# Patient Record
Sex: Female | Born: 2000 | Race: White | Hispanic: No | Marital: Single | Smoking: Never smoker
Health system: Southern US, Community
[De-identification: ages and names within clinical notes are randomized; demographics above are authoritative.]

## PROBLEM LIST (undated history)

## (undated) DIAGNOSIS — Z789 Other specified health status: Secondary | ICD-10-CM

## (undated) HISTORY — PX: NO PAST SURGERIES: SHX2092

---
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
## 2019-01-09 DIAGNOSIS — Z1152 Encounter for screening for COVID-19: Secondary | ICD-10-CM | POA: Diagnosis not present

## 2019-01-12 ENCOUNTER — Ambulatory Visit: Payer: BLUE CROSS/BLUE SHIELD | Attending: Internal Medicine

## 2019-01-12 DIAGNOSIS — Z20822 Contact with and (suspected) exposure to covid-19: Secondary | ICD-10-CM

## 2019-01-13 LAB — NOVEL CORONAVIRUS, NAA: SARS-CoV-2, NAA: NOT DETECTED

## 2019-02-28 DIAGNOSIS — Z1152 Encounter for screening for COVID-19: Secondary | ICD-10-CM | POA: Diagnosis not present

## 2019-03-23 ENCOUNTER — Other Ambulatory Visit: Payer: BLUE CROSS/BLUE SHIELD

## 2019-04-30 ENCOUNTER — Other Ambulatory Visit: Payer: Self-pay | Admitting: Obstetrics and Gynecology

## 2019-04-30 NOTE — Telephone Encounter (Signed)
Medication refill request: Junel  Last AEX:  11/27/2018  Next AEX:  08/20/2019 with JJ Last MMG (if hormonal medication request): n/a Refill authorized: #[id number], [id number] pended.   Please advise and refill if appropriate.

## 2019-05-15 DIAGNOSIS — H1010 Acute atopic conjunctivitis, unspecified eye: Secondary | ICD-10-CM | POA: Diagnosis not present

## 2019-05-15 DIAGNOSIS — R509 Fever, unspecified: Secondary | ICD-10-CM | POA: Diagnosis not present

## 2019-05-15 DIAGNOSIS — J309 Allergic rhinitis, unspecified: Secondary | ICD-10-CM | POA: Diagnosis not present

## 2019-05-18 ENCOUNTER — Ambulatory Visit: Payer: BLUE CROSS/BLUE SHIELD | Admitting: Nurse Practitioner

## 2019-05-18 ENCOUNTER — Encounter: Payer: Self-pay | Admitting: Nurse Practitioner

## 2019-05-18 ENCOUNTER — Other Ambulatory Visit: Payer: Self-pay

## 2019-05-18 VITALS — BP 110/72 | HR 111 | Temp 98.2°F | Resp 18 | Wt 152.0 lb

## 2019-05-18 DIAGNOSIS — W57XXXA Bitten or stung by nonvenomous insect and other nonvenomous arthropods, initial encounter: Secondary | ICD-10-CM | POA: Diagnosis not present

## 2019-05-18 DIAGNOSIS — S0086XA Insect bite (nonvenomous) of other part of head, initial encounter: Secondary | ICD-10-CM

## 2019-05-18 DIAGNOSIS — T7840XA Allergy, unspecified, initial encounter: Secondary | ICD-10-CM

## 2019-05-18 NOTE — Progress Notes (Signed)
Established Patient Office Visit  Subjective:  Patient ID: Kayla May, female    DOB: [birth year]  Age: 19 y.o. MRN: [id number]  CC:  Chief Complaint  Patient presents with  . Insect Bite    05/04, on forehead, fever 101 and eyes swollen, fever went away and came back on 05/06, lump on L side of neck    HPI Kayla May  Is a 19 year old female presenting for a bug bite that occurred Tuesday last week.  She developed a fever of 101 with bilateral orbital swelling that led her to seek urgent care on Wed. Her fever had subsided by then. The Urgent care instructed her to take zyrtec in daytime and benadryl at bedtime with allergy eye drops. They prescribed her prednisone instructing her to start taking if the eye drops did not decrease the periorbital swelling. Today with the periorbital swelling continuing she did take two doses of the prednisone and her swelling has resolved. She denied fever, chills, gu/gi sxs, edema, sob, throat or facial swelling.   Past Medical History:  Diagnosis Date  . Medical history non-contributory     Past Surgical History:  Procedure Laterality Date  . ANTERIOR CRUCIATE LIGAMENT REPAIR Right 07/18/2017   Procedure: ANTERIOR CRUCIATE LIGAMENT (ACL) REPAIR and meniscal repair lateral and medial;  Surgeon: Carole Civil, MD;  Location: [facility];  Service: Orthopedics;  Laterality: Right;  on 07/18/17  . NO PAST SURGERIES      Family History  Problem Relation Age of Onset  . Diabetes Father   . Heart disease Father   . Cancer Father   . Heart attack Father   . Thyroid disease Mother     Social History   Socioeconomic History  . Marital status: Single    Spouse name: Not on file  . Number of children: Not on file  . Years of education: Not on file  . Highest education level: Not on file  Occupational History  . Not on file  Tobacco Use  . Smoking status: Never Smoker  . Smokeless tobacco: Never Used  Substance and Sexual Activity  .  Alcohol use: No  . Drug use: No  . Sexual activity: Not Currently    Birth control/protection: Pill  Other Topics Concern  . Not on file  Social History Narrative  . Not on file   Social Determinants of Health   Financial Resource Strain:   . Difficulty of Paying Living Expenses:   Food Insecurity:   . Worried About Charity fundraiser in the Last Year:   . Arboriculturist in the Last Year:   Transportation Needs:   . Film/video editor (Medical):   Marland Kitchen Lack of Transportation (Non-Medical):   Physical Activity:   . Days of Exercise per Week:   . Minutes of Exercise per Session:   Stress:   . Feeling of Stress :   Social Connections:   . Frequency of Communication with Friends and Family:   . Frequency of Social Gatherings with Friends and Family:   . Attends Religious Services:   . Active Member of Clubs or Organizations:   . Attends Archivist Meetings:   Marland Kitchen Marital Status:   Intimate Partner Violence:   . Fear of Current or Ex-Partner:   . Emotionally Abused:   Marland Kitchen Physically Abused:   . Sexually Abused:     Outpatient Medications Prior to Visit  Medication Sig Dispense Refill  . JUNEL  FE 1/20 1-20 MG-MCG tablet TAKE ONE TABLET BY MOUTH ONCE DAILY. 84 tablet 0   No facility-administered medications prior to visit.    Allergies  Allergen Reactions  . Penicillins Swelling and Other (See Comments)    Has patient had a PCN reaction causing immediate rash, facial/tongue/throat swelling, SOB or lightheadedness with hypotension: Yes Has patient had a PCN reaction causing severe rash involving mucus membranes or skin necrosis: No Has patient had a PCN reaction that required hospitalization: No Has patient had a PCN reaction occurring within the last 10 years: No If all of the above answers are "NO", then may proceed with Cephalosporin use.   . Bee Venom Hives  . Blueberry [Vaccinium Angustifolium] Swelling    ROS Review of Systems  All other systems  reviewed and are negative.     Objective:    Physical Exam  Constitutional: She is oriented to person, place, and time. She appears well-developed and well-nourished.  HENT:  Head: Normocephalic.  Right Ear: Hearing normal.  Left Ear: Hearing normal.  Nose: Nose normal.  Eyes: Pupils are equal, round, and reactive to light. Conjunctivae, EOM and lids are normal. Lids are everted and swept, no foreign bodies found.  No periorbital swelling  Neck: No JVD present.  Cardiovascular: Normal rate.  Pulmonary/Chest: Effort normal.  Musculoskeletal:     Cervical back: Normal range of motion and neck supple.  Neurological: She is alert and oriented to person, place, and time.  Skin: Skin is warm and dry. No erythema.  Psychiatric: She has a normal mood and affect. Her speech is normal and behavior is normal. Judgment and thought content normal. Cognition and memory are normal.    BP 110/72 (BP Location: Left Arm, Patient Position: Sitting, Cuff Size: Normal)   Pulse (!) 111   Temp 98.2 F (36.8 C) (Temporal)   Resp 18   Wt 152 lb (68.9 kg)   SpO2 99%   BMI 23.31 kg/m  Wt Readings from Last 3 Encounters:  05/18/19 152 lb (68.9 kg)  11/27/18 148 lb 6.4 oz (67.3 kg) (78 %, Z= 0.79)*  08/19/18 143 lb 9.6 oz (65.1 kg) (74 %, Z= 0.65)*   * Growth percentiles are based on CDC (Girls, 2-20 Years) data.     There are no preventive care reminders to display for this patient.  There are no preventive care reminders to display for this patient.  Lab Results  Component Value Date   TSH 1.47 05/13/2018   Lab Results  Component Value Date   WBC 7.2 08/19/2018   HGB 14.4 08/19/2018   HCT 43.7 08/19/2018   MCV 99 (H) 08/19/2018   PLT 305 08/19/2018   Lab Results  Component Value Date   NA 143 08/19/2018   K 4.5 08/19/2018   CO2 25 08/19/2018   GLUCOSE 90 08/19/2018   BUN 13 08/19/2018   CREATININE 0.91 08/19/2018   BILITOT 0.5 08/19/2018   ALKPHOS 57 08/19/2018   AST 14  08/19/2018   ALT 7 08/19/2018   PROT 7.2 08/19/2018   ALBUMIN 4.6 08/19/2018   CALCIUM 9.6 08/19/2018   ANIONGAP 8 07/15/2017   Lab Results  Component Value Date   CHOL 190 (H) 08/19/2018   Lab Results  Component Value Date   HDL 51 08/19/2018   Lab Results  Component Value Date   LDLCALC 113 (H) 08/19/2018   Lab Results  Component Value Date   TRIG 130 (H) 08/19/2018   Lab Results  Component Value  Date   CHOLHDL 3.7 08/19/2018   Lab Results  Component Value Date   HGBA1C 5.0 08/19/2018      Assessment & Plan:  Since starting the prednisone your sxs have nearly resolved, your sxs are most likely allergic reaction to a bug bite of unknown type, you should Continue AM zyrtec, bedtime Benadryl , Prednisone taper dose pack as instructed by urgent care.  May add Over the counter Pepcid 20 mg twice daily.   Problem List Items Addressed This Visit    None    Visit Diagnoses    Bug bite, initial encounter    -  Primary   Allergic reaction, initial encounter          No orders of the defined types were placed in this encounter.   Follow-up: Return if symptoms worsen or fail to improve.    Elmore Guise, FNP

## 2019-05-24 ENCOUNTER — Other Ambulatory Visit: Payer: Self-pay

## 2019-05-24 ENCOUNTER — Ambulatory Visit
Admission: EM | Admit: 2019-05-24 | Discharge: 2019-05-24 | Disposition: A | Discharge status: Home / Self Care | Payer: BLUE CROSS/BLUE SHIELD | Attending: Emergency Medicine | Admitting: Emergency Medicine

## 2019-05-24 DIAGNOSIS — R0981 Nasal congestion: Secondary | ICD-10-CM | POA: Diagnosis not present

## 2019-05-24 DIAGNOSIS — J029 Acute pharyngitis, unspecified: Secondary | ICD-10-CM | POA: Diagnosis not present

## 2019-05-24 DIAGNOSIS — H66001 Acute suppurative otitis media without spontaneous rupture of ear drum, right ear: Secondary | ICD-10-CM

## 2019-05-24 LAB — POCT RAPID STREP A (OFFICE): Rapid Strep A Screen: NEGATIVE

## 2019-05-24 MED ORDER — AZITHROMYCIN 250 MG PO TABS
250.0000 mg | ORAL_TABLET | Freq: Every day | ORAL | 0 refills | Status: DC
Start: 2019-05-24 — End: 2019-08-20

## 2019-05-24 NOTE — ED Provider Notes (Signed)
[place]   [id number] 05/24/19 Arrival Time: 2694   CC: URI  SUBJECTIVE: History from: patient and family.  Kayla May is a 19 y.o. female who presents with fatigue, LT ear pain, nasal congestion, and sore throat x 3 days .  Denies sick exposure to COVID, flu or strep.  Has tried OTC medications without relief.  Symptoms are made worse with swallowing, but tolerating liquids and own secretions.  Tested positive for COVID in march.  Denies fever, chills, SOB, wheezing, chest pain, nausea, changes in bowel or bladder habits.     ROS: As per HPI.  All other pertinent ROS negative.     Past Medical History:  Diagnosis Date  . Medical history non-contributory    Past Surgical History:  Procedure Laterality Date  . ANTERIOR CRUCIATE LIGAMENT REPAIR Right 07/18/2017   Procedure: ANTERIOR CRUCIATE LIGAMENT (ACL) REPAIR and meniscal repair lateral and medial;  Surgeon: Carole Civil, MD;  Location: [facility];  Service: Orthopedics;  Laterality: Right;  on 07/18/17  . NO PAST SURGERIES     Allergies  Allergen Reactions  . Penicillins Swelling and Other (See Comments)    Has patient had a PCN reaction causing immediate rash, facial/tongue/throat swelling, SOB or lightheadedness with hypotension: Yes Has patient had a PCN reaction causing severe rash involving mucus membranes or skin necrosis: No Has patient had a PCN reaction that required hospitalization: No Has patient had a PCN reaction occurring within the last 10 years: No If all of the above answers are "NO", then may proceed with Cephalosporin use.   . Bee Venom Hives  . Blueberry [Vaccinium Angustifolium] Swelling   No current facility-administered medications on file prior to encounter.   Current Outpatient Medications on File Prior to Encounter  Medication Sig Dispense Refill  . JUNEL FE 1/20 1-20 MG-MCG tablet TAKE ONE TABLET BY MOUTH ONCE DAILY. 84 tablet 0   Social History   Socioeconomic History    . Marital status: Single    Spouse name: Not on file  . Number of children: Not on file  . Years of education: Not on file  . Highest education level: Not on file  Occupational History  . Not on file  Tobacco Use  . Smoking status: Never Smoker  . Smokeless tobacco: Never Used  Substance and Sexual Activity  . Alcohol use: No  . Drug use: No  . Sexual activity: Not Currently    Birth control/protection: Pill  Other Topics Concern  . Not on file  Social History Narrative  . Not on file   Social Determinants of Health   Financial Resource Strain:   . Difficulty of Paying Living Expenses:   Food Insecurity:   . Worried About Charity fundraiser in the Last Year:   . Arboriculturist in the Last Year:   Transportation Needs:   . Film/video editor (Medical):   Marland Kitchen Lack of Transportation (Non-Medical):   Physical Activity:   . Days of Exercise per Week:   . Minutes of Exercise per Session:   Stress:   . Feeling of Stress :   Social Connections:   . Frequency of Communication with Friends and Family:   . Frequency of Social Gatherings with Friends and Family:   . Attends Religious Services:   . Active Member of Clubs or Organizations:   . Attends Archivist Meetings:   Marland Kitchen Marital Status:   Intimate Partner Violence:   . Fear of  Current or Ex-Partner:   . Emotionally Abused:   Marland Kitchen Physically Abused:   . Sexually Abused:    Family History  Problem Relation Age of Onset  . Diabetes Father   . Heart disease Father   . Cancer Father   . Heart attack Father   . Thyroid disease Mother     OBJECTIVE:  Vitals:   05/24/19 0950  BP: 117/78  Pulse: 91  Resp: 18  Temp: 98.5 F (36.9 C)  SpO2: 98%     General appearance: alert; appears fatigued, but nontoxic; speaking in full sentences and tolerating own secretions HEENT: NCAT; Ears: EACs clear, TMs erythematous; Eyes: PERRL.  EOM grossly intact. Sinuses: nontender; Nose: nares patent without rhinorrhea,  Throat: oropharynx clear, tonsils erythematous mildly enlarged, uvula midline  Neck: supple without LAD Lungs: unlabored respirations, symmetrical air entry; cough: absent; no respiratory distress; CTAB Heart: regular rate and rhythm.  Skin: warm and dry Psychological: alert and cooperative; normal mood and affect  LABS:  Results for orders placed or performed during the hospital encounter of 05/24/19 (from the past 24 hour(s))  POCT rapid strep A     Status: None   Collection Time: 05/24/19  9:54 AM  Result Value Ref Range   Rapid Strep A Screen Negative Negative     ASSESSMENT & PLAN:  1. Non-recurrent acute suppurative otitis media of right ear without spontaneous rupture of tympanic membrane   2. Sore throat   3. Nasal congestion     Meds ordered this encounter  Medications  . azithromycin (ZITHROMAX) 250 MG tablet    Sig: Take 1 tablet (250 mg total) by mouth daily. Take first 2 tablets together, then 1 every day until finished.    Dispense:  6 tablet    Refill:  0    Order Specific Question:   Supervising Provider    Answer:   Eustace Moore [[id number]]   Strep negative.  Culture sent Get plenty of rest and push fluids Azithromycin prescribed.  Take as directed and to completion Use OTC zyrtec for nasal congestion, runny nose, and/or sore throat Use OTC flonase for nasal congestion and runny nose Use medications daily for symptom relief Use OTC medications like ibuprofen or tylenol as needed fever or pain Call or go to the ED if you have any new or worsening symptoms such as fever, cough, shortness of breath, chest tightness, chest pain, turning blue, changes in mental status, etc...    Reviewed expectations re: course of current medical issues. Questions answered. Outlined signs and symptoms indicating need for more acute intervention. Patient verbalized understanding. After Visit Summary given.         Rennis Harding, PA-C 05/24/19 1003

## 2019-05-24 NOTE — ED Triage Notes (Signed)
Pt presents with c/o nasal congestion and sore throat for past 3 days  

## 2019-05-24 NOTE — Discharge Instructions (Addendum)
Strep negative.  Culture sent Get plenty of rest and push fluids Azithromycin prescribed.  Take as directed and to completion Use OTC zyrtec for nasal congestion, runny nose, and/or sore throat Use OTC flonase for nasal congestion and runny nose Use medications daily for symptom relief Use OTC medications like ibuprofen or tylenol as needed fever or pain Call or go to the ED if you have any new or worsening symptoms such as fever, cough, shortness of breath, chest tightness, chest pain, turning blue, changes in mental status, etc..Marland Kitchen

## 2019-05-27 LAB — CULTURE, GROUP A STREP (THRC)

## 2019-07-27 ENCOUNTER — Other Ambulatory Visit: Payer: Self-pay

## 2019-07-27 MED ORDER — NORETHIN ACE-ETH ESTRAD-FE 1-20 MG-MCG PO TABS: 1.0000 | ORAL_TABLET | Freq: Every day | ORAL | 0 refills | Status: DC

## 2019-07-27 NOTE — Telephone Encounter (Signed)
Patient is calling to check status of refill for birth control.  

## 2019-07-27 NOTE — Telephone Encounter (Signed)
Medication refill request: JUNEL FE Last AEX:  08/19/18 JJ Next AEX: 08/20/19 Last MMG (if hormonal medication request): n/a Refill authorized: Today, please advise; Dr. Oscar La out of office 07/27/19

## 2019-07-27 NOTE — Telephone Encounter (Signed)
Patient has been notified that refill for birth control has been sent to pharmacy. Patient verbalizes understanding and is agreeable. Okay to close encounter.

## 2019-07-30 DIAGNOSIS — Z20822 Contact with and (suspected) exposure to covid-19: Secondary | ICD-10-CM | POA: Diagnosis not present

## 2019-07-30 DIAGNOSIS — Z03818 Encounter for observation for suspected exposure to other biological agents ruled out: Secondary | ICD-10-CM | POA: Diagnosis not present

## 2019-08-19 NOTE — Progress Notes (Signed)
19 y.o. G0P0000 Single White or Caucasian Not Hispanic or Latino female here for annual exam. Patient says that she is very moody on her birth control.  Her mom feels she is moody all the time. She feels she is just moody prior to her cycle. She doesn't want to change her pill.  She has been sexually active in the last year, not currently active. Period Cycle (Days): 28 Period Duration (Days): 7 Period Pattern: Regular Menstrual Flow: Light, Moderate, Heavy Menstrual Control: Tampon Menstrual Control Change Freq (Hours): 3 Dysmenorrhea: None  Patient's last menstrual period was 08/18/2019.          Sexually active: Yes.    The current method of family planning is abstinence.    Exercising: Yes.    Gym/ health club routine includes cardio and light weights. Smoker:  no  Health Maintenance: Pap:  none History of abnormal Pap:  no MMG:  08-22-2018 left breast u/s birads 1:neg BMD:   none Colonoscopy: none TDaP:  06/27/10 Gardasil: no, declines   reports that she has never smoked. She has never used smokeless tobacco. She reports that she does not drink alcohol and does not use drugs. 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   Past Medical History:  Diagnosis Date  . Medical history non-contributory     Past Surgical History:  Procedure Laterality Date  . ANTERIOR CRUCIATE LIGAMENT REPAIR Right 07/18/2017   Procedure: ANTERIOR CRUCIATE LIGAMENT (ACL) REPAIR and meniscal repair lateral and medial;  Surgeon: Vickki Hearing, MD;  Location: [facility];  Service: Orthopedics;  Laterality: Right;  on 07/18/17  . NO PAST SURGERIES      Current Outpatient Medications  Medication Sig Dispense Refill  . norethindrone-ethinyl estradiol (JUNEL FE 1/20) 1-20 MG-MCG tablet Take 1 tablet by mouth daily. 28 tablet 0   No current facility-administered medications for this visit.    Family History  Problem Relation Age of Onset  .  Diabetes Father   . Heart disease Father   . Cancer Father   . Heart attack Father   . Thyroid disease Mother     Review of Systems  Psychiatric/Behavioral: The patient is nervous/anxious.   All other systems reviewed and are negative.   Exam:   BP (!) 100/52   Pulse 91   Ht 5\' 8"  (1.727 m)   Wt 146 lb (66.2 kg)   LMP 08/18/2019   SpO2 99%   BMI 22.20 kg/m   Weight change: @WEIGHTCHANGE @ Height:   Height: 5\' 8"  (172.7 cm)  Ht Readings from Last 3 Encounters:  08/20/19 5\' 8"  (1.727 m)  08/19/18 5' 7.72" (1.72 m) (91 %, Z= 1.35)*  05/13/18 5\' 9"  (1.753 m) (97 %, Z= 1.86)*   * Growth percentiles are based on CDC (Girls, 2-20 Years) data.    General appearance: alert, cooperative and appears stated age Head: Normocephalic, without obvious abnormality, atraumatic Neck: no adenopathy, supple, symmetrical, trachea midline and thyroid normal to inspection and palpation Lungs: clear to auscultation bilaterally Cardiovascular: regular rate and rhythm Breasts: normal appearance, no masses or tenderness Abdomen: soft, non-tender; non distended,  no masses,  no organomegaly Extremities: extremities normal, atraumatic, no cyanosis or edema Skin: Skin color, texture, turgor normal. No rashes or lesions Lymph nodes: Cervical, supraclavicular, and axillary nodes normal. No abnormal inguinal nodes palpated Neurologic: Grossly normal Pelvic: deferred   A:  Well Woman with normal exam  On OCP's, some mood changes, declines  going off of pills or changing he pill  FH of DM  P:   No pap until next year  Self collect vaginal swab  STD testing  Screening labs, HgbA1C  Discussed breast self exam  Discussed calcium and vit D intake

## 2019-08-20 ENCOUNTER — Ambulatory Visit: Payer: BLUE CROSS/BLUE SHIELD | Admitting: Obstetrics and Gynecology

## 2019-08-20 ENCOUNTER — Other Ambulatory Visit: Payer: Self-pay

## 2019-08-20 ENCOUNTER — Encounter: Payer: Self-pay | Admitting: Obstetrics and Gynecology

## 2019-08-20 VITALS — BP 100/52 | HR 91 | Ht 68.0 in | Wt 146.0 lb

## 2019-08-20 DIAGNOSIS — Z0000 Encounter for general adult medical examination without abnormal findings: Secondary | ICD-10-CM | POA: Diagnosis not present

## 2019-08-20 DIAGNOSIS — Z01419 Encounter for gynecological examination (general) (routine) without abnormal findings: Secondary | ICD-10-CM

## 2019-08-20 DIAGNOSIS — Z113 Encounter for screening for infections with a predominantly sexual mode of transmission: Secondary | ICD-10-CM | POA: Diagnosis not present

## 2019-08-20 DIAGNOSIS — Z3041 Encounter for surveillance of contraceptive pills: Secondary | ICD-10-CM

## 2019-08-20 DIAGNOSIS — Z833 Family history of diabetes mellitus: Secondary | ICD-10-CM | POA: Diagnosis not present

## 2019-08-20 NOTE — Patient Instructions (Signed)
EXERCISE AND DIET:  We recommended that you start or continue a regular exercise program for good health. Regular exercise means any activity that makes your heart beat faster and makes you sweat.  We recommend exercising at least 30 minutes per day at least 3 days a week, preferably 4 or 5.  We also recommend a diet low in fat and sugar.  Inactivity, poor dietary choices and obesity can cause diabetes, heart attack, stroke, and kidney damage, among others.    ALCOHOL AND SMOKING:  Women should limit their alcohol intake to no more than 7 drinks/beers/glasses of wine (combined, not each!) per week. Moderation of alcohol intake to this level decreases your risk of breast cancer and liver damage. And of course, no recreational drugs are part of a healthy lifestyle.  And absolutely no smoking or even second hand smoke. Most people know smoking can cause heart and lung diseases, but did you know it also contributes to weakening of your bones? Aging of your skin?  Yellowing of your teeth and nails?  CALCIUM AND VITAMIN D:  Adequate intake of calcium and Vitamin D are recommended.  The recommendations for exact amounts of these supplements seem to change often, but generally speaking 1,000 mg of calcium (between diet and supplement) and 800 units of Vitamin D per day seems prudent. Certain women may benefit from higher intake of Vitamin D.  If you are among these women, your doctor will have told you during your visit.    PAP SMEARS:  Pap smears, to check for cervical cancer or precancers,  have traditionally been done yearly, although recent scientific advances have shown that most women can have pap smears less often.  However, every woman still should have a physical exam from her gynecologist every year. It will include a breast check, inspection of the vulva and vagina to check for abnormal growths or skin changes, a visual exam of the cervix, and then an exam to evaluate the size and shape of the uterus and  ovaries.  And after 20 years of age, a rectal exam is indicated to check for rectal cancers. We will also provide age appropriate advice regarding health maintenance, like when you should have certain vaccines, screening for sexually transmitted diseases, bone density testing, colonoscopy, mammograms, etc.   MAMMOGRAMS:  All women over 40 years old should have a yearly mammogram. Many facilities now offer a "3D" mammogram, which may cost around $50 extra out of pocket. If possible,  we recommend you accept the option to have the 3D mammogram performed.  It both reduces the number of women who will be called back for extra views which then turn out to be normal, and it is better than the routine mammogram at detecting truly abnormal areas.    COLON CANCER SCREENING: Now recommend starting at age 45. At this time colonoscopy is not covered for routine screening until 50. There are take home tests that can be done between 45-49.   COLONOSCOPY:  Colonoscopy to screen for colon cancer is recommended for all women at age 50.  We know, you hate the idea of the prep.  We agree, BUT, having colon cancer and not knowing it is worse!!  Colon cancer so often starts as a polyp that can be seen and removed at colonscopy, which can quite literally save your life!  And if your first colonoscopy is normal and you have no family history of colon cancer, most women don't have to have it again for   10 years.  Once every ten years, you can do something that may end up saving your life, right?  We will be happy to help you get it scheduled when you are ready.  Be sure to check your insurance coverage so you understand how much it will cost.  It may be covered as a preventative service at no cost, but you should check your particular policy.      Breast Self-Awareness Breast self-awareness means being familiar with how your breasts look and feel. It involves checking your breasts regularly and reporting any changes to your  health care provider. Practicing breast self-awareness is important. A change in your breasts can be a sign of a serious medical problem. Being familiar with how your breasts look and feel allows you to find any problems early, when treatment is more likely to be successful. All women should practice breast self-awareness, including women who have had breast implants. How to do a breast self-exam One way to learn what is normal for your breasts and whether your breasts are changing is to do a breast self-exam. To do a breast self-exam: Look for Changes  1. Remove all the clothing above your waist. 2. Stand in front of a mirror in a room with good lighting. 3. Put your hands on your hips. 4. Push your hands firmly downward. 5. Compare your breasts in the mirror. Look for differences between them (asymmetry), such as: ? Differences in shape. ? Differences in size. ? Puckers, dips, and bumps in one breast and not the other. 6. Look at each breast for changes in your skin, such as: ? Redness. ? Scaly areas. 7. Look for changes in your nipples, such as: ? Discharge. ? Bleeding. ? Dimpling. ? Redness. ? A change in position. Feel for Changes Carefully feel your breasts for lumps and changes. It is best to do this while lying on your back on the floor and again while sitting or standing in the shower or tub with soapy water on your skin. Feel each breast in the following way:  Place the arm on the side of the breast you are examining above your head.  Feel your breast with the other hand.  Start in the nipple area and make  inch (2 cm) overlapping circles to feel your breast. Use the pads of your three middle fingers to do this. Apply light pressure, then medium pressure, then firm pressure. The light pressure will allow you to feel the tissue closest to the skin. The medium pressure will allow you to feel the tissue that is a little deeper. The firm pressure will allow you to feel the tissue  close to the ribs.  Continue the overlapping circles, moving downward over the breast until you feel your ribs below your breast.  Move one finger-width toward the center of the body. Continue to use the  inch (2 cm) overlapping circles to feel your breast as you move slowly up toward your collarbone.  Continue the up and down exam using all three pressures until you reach your armpit.  Write Down What You Find  Write down what is normal for each breast and any changes that you find. Keep a written record with breast changes or normal findings for each breast. By writing this information down, you do not need to depend only on memory for size, tenderness, or location. Write down where you are in your menstrual cycle, if you are still menstruating. If you are having trouble noticing differences   in your breasts, do not get discouraged. With time you will become more familiar with the variations in your breasts and more comfortable with the exam. How often should I examine my breasts? Examine your breasts every month. If you are breastfeeding, the best time to examine your breasts is after a feeding or after using a breast pump. If you menstruate, the best time to examine your breasts is 5-7 days after your period is over. During your period, your breasts are lumpier, and it may be more difficult to notice changes. When should I see my health care provider? See your health care provider if you notice:  A change in shape or size of your breasts or nipples.  A change in the skin of your breast or nipples, such as a reddened or scaly area.  Unusual discharge from your nipples.  A lump or thick area that was not there before.  Pain in your breasts.  Anything that concerns you.  

## 2019-08-21 ENCOUNTER — Other Ambulatory Visit: Payer: Self-pay | Admitting: *Deleted

## 2019-08-21 DIAGNOSIS — E782 Mixed hyperlipidemia: Secondary | ICD-10-CM

## 2019-08-21 LAB — CBC
Hematocrit: 41.5 % (ref 34.0–46.6)
Hemoglobin: 14.6 g/dL (ref 11.1–15.9)
MCH: 32.9 pg (ref 26.6–33.0)
MCHC: 35.2 g/dL (ref 31.5–35.7)
MCV: 94 fL (ref 79–97)
Platelets: 251 10*3/uL (ref 150–450)
RBC: 4.44 x10E6/uL (ref 3.77–5.28)
RDW: 12.1 % (ref 11.7–15.4)
WBC: 5.6 10*3/uL (ref 3.4–10.8)

## 2019-08-21 LAB — COMPREHENSIVE METABOLIC PANEL
ALT: 11 IU/L (ref 0–32)
AST: 14 IU/L (ref 0–40)
Albumin/Globulin Ratio: 1.8 (ref 1.2–2.2)
Albumin: 4.6 g/dL (ref 3.9–5.0)
Alkaline Phosphatase: 54 IU/L (ref 45–106)
BUN/Creatinine Ratio: 18 (ref 9–23)
BUN: 12 mg/dL (ref 6–20)
Bilirubin Total: 0.4 mg/dL (ref 0.0–1.2)
CO2: 26 mmol/L (ref 20–29)
Calcium: 9.9 mg/dL (ref 8.7–10.2)
Chloride: 101 mmol/L (ref 96–106)
Creatinine, Ser: 0.68 mg/dL (ref 0.57–1.00)
GFR calc Af Amer: 146 mL/min/{1.73_m2} (ref >59)
GFR calc non Af Amer: 126 mL/min/{1.73_m2} (ref >59)
Globulin, Total: 2.5 g/dL (ref 1.5–4.5)
Glucose: 84 mg/dL (ref 65–99)
Potassium: 4.6 mmol/L (ref 3.5–5.2)
Sodium: 139 mmol/L (ref 134–144)
Total Protein: 7.1 g/dL (ref 6.0–8.5)

## 2019-08-21 LAB — HEMOGLOBIN A1C
Est. average glucose Bld gHb Est-mCnc: 100 mg/dL
Hgb A1c MFr Bld: 5.1 % (ref 4.8–5.6)

## 2019-08-21 LAB — CHLAMYDIA/GONOCOCCUS/TRICHOMONAS, NAA
Chlamydia by NAA: NEGATIVE
Gonococcus by NAA: NEGATIVE
Trich vag by NAA: NEGATIVE

## 2019-08-21 LAB — LIPID PANEL
Chol/HDL Ratio: 4.4 ratio (ref 0.0–4.4)
Cholesterol, Total: 244 mg/dL — ABNORMAL HIGH (ref 100–199)
HDL: 55 mg/dL (ref >39)
LDL Chol Calc (NIH): 157 mg/dL — ABNORMAL HIGH (ref 0–99)
Triglycerides: 178 mg/dL — ABNORMAL HIGH (ref 0–149)
VLDL Cholesterol Cal: 32 mg/dL (ref 5–40)

## 2019-08-21 LAB — RPR: RPR Ser Ql: NONREACTIVE

## 2019-08-21 LAB — HIV ANTIBODY (ROUTINE TESTING W REFLEX): HIV Screen 4th Generation wRfx: NONREACTIVE

## 2019-08-24 DIAGNOSIS — Z20822 Contact with and (suspected) exposure to covid-19: Secondary | ICD-10-CM | POA: Diagnosis not present

## 2019-08-24 DIAGNOSIS — Z03818 Encounter for observation for suspected exposure to other biological agents ruled out: Secondary | ICD-10-CM | POA: Diagnosis not present

## 2019-09-01 ENCOUNTER — Other Ambulatory Visit: Payer: Self-pay

## 2019-09-03 ENCOUNTER — Other Ambulatory Visit: Payer: Self-pay

## 2019-09-04 ENCOUNTER — Other Ambulatory Visit: Payer: Self-pay

## 2019-09-08 ENCOUNTER — Other Ambulatory Visit: Payer: Self-pay

## 2019-09-08 ENCOUNTER — Other Ambulatory Visit (INDEPENDENT_AMBULATORY_CARE_PROVIDER_SITE_OTHER): Payer: BLUE CROSS/BLUE SHIELD

## 2019-09-08 DIAGNOSIS — E782 Mixed hyperlipidemia: Secondary | ICD-10-CM

## 2019-09-09 LAB — LIPID PANEL
Chol/HDL Ratio: 4 ratio (ref 0.0–4.4)
Cholesterol, Total: 171 mg/dL (ref 100–199)
HDL: 43 mg/dL (ref >39)
LDL Chol Calc (NIH): 106 mg/dL — ABNORMAL HIGH (ref 0–99)
Triglycerides: 120 mg/dL (ref 0–149)
VLDL Cholesterol Cal: 22 mg/dL (ref 5–40)

## 2019-09-30 ENCOUNTER — Encounter: Payer: Self-pay | Admitting: Obstetrics and Gynecology

## 2019-09-30 ENCOUNTER — Telehealth: Payer: Self-pay

## 2019-09-30 MED ORDER — NORETHIN ACE-ETH ESTRAD-FE 1-20 MG-MCG PO TABS: 1.0000 | ORAL_TABLET | Freq: Every day | ORAL | 3 refills | Status: DC

## 2019-09-30 NOTE — Telephone Encounter (Signed)
Pt sent following mychart message:  Kayla May, [facility] Good Morning,   I have been experiencing brown discharge since monday morning and it lightens up during the day and then gets heavy and goes back and forth. I have take two pregnancy test and there's no chance I am pregnant so I just want to make sure this is not super abnormal.   My next period isn't until two ish weeks.

## 2019-09-30 NOTE — Telephone Encounter (Signed)
AEX 08/20/19 with JJ  Spoke with pt. Pt states having BTB while taking OCPs. Pt states missed pill on Saturday and starting having light brown discharge on Monday.  States took 2 pills on Sunday. Pt states only seeing when wiping, not wearing a pad or liner. Advised pt normal BTB when missed/skipped pills. Pt advised to keep taking OCPs at same time every day and not to skip or miss pills. Pt currently SA and took 2 negative home UPTs. Pt advised can get pregnant with missed/skipped pills and to use extra protection with condoms.  Pt advised to continue to calender bleeding and if becomes heavy or clots, then contact office or be seen in ER. Pt agreeable to plan of care and verbalized understanding.   Pt aslo states would like to continue taking same OCPs and needs refills. Sent 1 year supply #84, 3 RF to get to next AEX to pharmacy on file. Pt aware and verbalized understanding.   Routing to Dr Oscar La for review Encounter closed

## 2019-10-07 DIAGNOSIS — Z20828 Contact with and (suspected) exposure to other viral communicable diseases: Secondary | ICD-10-CM | POA: Diagnosis not present

## 2019-10-12 DIAGNOSIS — Z20828 Contact with and (suspected) exposure to other viral communicable diseases: Secondary | ICD-10-CM | POA: Diagnosis not present

## 2019-10-21 DIAGNOSIS — Z20828 Contact with and (suspected) exposure to other viral communicable diseases: Secondary | ICD-10-CM | POA: Diagnosis not present

## 2019-10-28 DIAGNOSIS — Z20828 Contact with and (suspected) exposure to other viral communicable diseases: Secondary | ICD-10-CM | POA: Diagnosis not present

## 2019-11-03 DIAGNOSIS — Z20828 Contact with and (suspected) exposure to other viral communicable diseases: Secondary | ICD-10-CM | POA: Diagnosis not present

## 2019-11-03 DIAGNOSIS — Z1152 Encounter for screening for COVID-19: Secondary | ICD-10-CM | POA: Diagnosis not present

## 2019-11-11 DIAGNOSIS — Z20828 Contact with and (suspected) exposure to other viral communicable diseases: Secondary | ICD-10-CM | POA: Diagnosis not present

## 2019-11-16 DIAGNOSIS — Z1152 Encounter for screening for COVID-19: Secondary | ICD-10-CM | POA: Diagnosis not present

## 2019-11-16 DIAGNOSIS — Z20828 Contact with and (suspected) exposure to other viral communicable diseases: Secondary | ICD-10-CM | POA: Diagnosis not present

## 2019-11-25 DIAGNOSIS — Z20828 Contact with and (suspected) exposure to other viral communicable diseases: Secondary | ICD-10-CM | POA: Diagnosis not present

## 2019-12-05 DIAGNOSIS — Z1152 Encounter for screening for COVID-19: Secondary | ICD-10-CM | POA: Diagnosis not present

## 2020-01-26 DIAGNOSIS — M545 Low back pain, unspecified: Secondary | ICD-10-CM | POA: Diagnosis not present

## 2020-01-28 DIAGNOSIS — M545 Low back pain, unspecified: Secondary | ICD-10-CM | POA: Diagnosis not present

## 2020-05-17 ENCOUNTER — Encounter: Payer: Self-pay | Admitting: Obstetrics and Gynecology

## 2020-07-07 ENCOUNTER — Encounter: Payer: Self-pay | Admitting: Obstetrics and Gynecology

## 2020-07-20 ENCOUNTER — Ambulatory Visit: Payer: BLUE CROSS/BLUE SHIELD | Admitting: Nurse Practitioner

## 2020-07-20 ENCOUNTER — Ambulatory Visit: Payer: BC Managed Care – PPO | Admitting: Nurse Practitioner

## 2020-07-20 ENCOUNTER — Other Ambulatory Visit: Payer: Self-pay

## 2020-07-20 ENCOUNTER — Encounter: Payer: Self-pay | Admitting: Nurse Practitioner

## 2020-07-20 VITALS — BP 118/74 | HR 97 | Temp 98.0°F | Ht 69.0 in | Wt 147.8 lb

## 2020-07-20 DIAGNOSIS — G245 Blepharospasm: Secondary | ICD-10-CM

## 2020-07-20 LAB — COMPLETE METABOLIC PANEL WITH GFR
AG Ratio: 2 (calc) (ref 1.0–2.5)
ALT: 8 U/L (ref 6–29)
AST: 16 U/L (ref 10–30)
Albumin: 4.4 g/dL (ref 3.6–5.1)
Alkaline phosphatase (APISO): 52 U/L (ref 31–125)
BUN: 15 mg/dL (ref 7–25)
CO2: 27 mmol/L (ref 20–32)
Calcium: 9.4 mg/dL (ref 8.6–10.2)
Chloride: 106 mmol/L (ref 98–110)
Creat: 0.8 mg/dL (ref 0.50–0.96)
Globulin: 2.2 g/dL (calc) (ref 1.9–3.7)
Glucose, Bld: 95 mg/dL (ref 65–99)
Potassium: 4.4 mmol/L (ref 3.5–5.3)
Sodium: 141 mmol/L (ref 135–146)
Total Bilirubin: 0.7 mg/dL (ref 0.2–1.2)
Total Protein: 6.6 g/dL (ref 6.1–8.1)
eGFR: 107 mL/min/{1.73_m2} (ref >=60)

## 2020-07-20 LAB — CBC WITH DIFFERENTIAL/PLATELET
Absolute Monocytes: 459 cells/uL (ref 200–950)
Basophils Absolute: 37 cells/uL (ref 0–200)
Basophils Relative: 0.6 %
Eosinophils Absolute: 68 cells/uL (ref 15–500)
Eosinophils Relative: 1.1 %
HCT: 41.2 % (ref 35.0–45.0)
Hemoglobin: 14.4 g/dL (ref 11.7–15.5)
Lymphs Abs: 1879 cells/uL (ref 850–3900)
MCH: 32.9 pg (ref 27.0–33.0)
MCHC: 35 g/dL (ref 32.0–36.0)
MCV: 94.1 fL (ref 80.0–100.0)
MPV: 8.9 fL (ref 7.5–12.5)
Monocytes Relative: 7.4 %
Neutro Abs: 3757 cells/uL (ref 1500–7800)
Neutrophils Relative %: 60.6 %
Platelets: 274 10*3/uL (ref 140–400)
RBC: 4.38 10*6/uL (ref 3.80–5.10)
RDW: 11.8 % (ref 11.0–15.0)
Total Lymphocyte: 30.3 %
WBC: 6.2 10*3/uL (ref 3.8–10.8)

## 2020-07-20 NOTE — Progress Notes (Signed)
Subjective:    Patient ID: Kayla May, female    DOB: Dec 23, 2000, 20 y.o.   MRN: [id number]  HPI: Kayla May is a 20 y.o. female presenting for eye twitching.  Chief Complaint  Patient presents with   Eye Twitching    Left eye twitching for about 7 mos now, went to eye doctor, told that it could be stress, denies having stress. having pain in the neck on lft side only, taking motrin for that   EYE TWITCHING Onset: 7 months ago,  Location: left eye Duration:7 months, lasts all day, some days does not have at all Pain: no pain, only pain when neck tenses Severity: no pain Characteristics: twitching Timing: all day randomly Aggravating Factors: nothing known Relieving Factors: nothing Treatments attempted: ibuprofen, helps with neck pain Relief with treatments attempted: yes Meaning to patient: no idea Associated signs & symptoms: neck pain   Water: drinks 3-4 bottles; 2 bottles, no pre workout or caffeine intake regularly.  Does lift weight and work out rigorously.  Eye doctor told her it was stress- has not felt stressed.  Thinks she is getting a gluten allergy - sister has gluten allergy.  Has been eating foods low in gluten and this is helping.    Allergies  Allergen Reactions   Penicillins Swelling and Other (See Comments)    Has patient had a PCN reaction causing immediate rash, facial/tongue/throat swelling, SOB or lightheadedness with hypotension: Yes Has patient had a PCN reaction causing severe rash involving mucus membranes or skin necrosis: No Has patient had a PCN reaction that required hospitalization: No Has patient had a PCN reaction occurring within the last 10 years: No If all of the above answers are "NO", then may proceed with Cephalosporin use.    Bee Venom Hives   Blueberry [Vaccinium Angustifolium] Swelling    Outpatient Encounter Medications as of 07/20/2020  Medication Sig   norethindrone-ethinyl estradiol (JUNEL FE 1/20) 1-20 MG-MCG  tablet Take 1 tablet by mouth daily.   No facility-administered encounter medications on file as of 07/20/2020.    Patient Active Problem List   Diagnosis Date Noted   Family history of diabetes mellitus (DM) 08/19/2018   S/P ACL repair right knee 07/18/17    Tear of lateral meniscus of right knee, current     Past Medical History:  Diagnosis Date   Medical history non-contributory     Relevant past medical, surgical, family and social history reviewed and updated as indicated. Interim medical history since our last visit reviewed.  Review of Systems Per HPI unless specifically indicated above     Objective:    BP 118/74   Pulse 97   Temp 98 F (36.7 C)   Ht 5\' 9"  (1.753 m)   Wt 147 lb 12.8 oz (67 kg)   LMP 07/19/2020   SpO2 98%   BMI 21.83 kg/m   Wt Readings from Last 3 Encounters:  07/20/20 147 lb 12.8 oz (67 kg)  08/20/19 146 lb (66.2 kg)  05/18/19 152 lb (68.9 kg)    Physical Exam Vitals and nursing note reviewed.  Constitutional:      General: She is not in acute distress.    Appearance: Normal appearance. She is not toxic-appearing.  HENT:     Head: Normocephalic and atraumatic.  Eyes:     General: No scleral icterus.       Right eye: No discharge.        Left eye: No discharge.  Extraocular Movements: Extraocular movements intact.     Pupils: Pupils are equal, round, and reactive to light.  Skin:    General: Skin is warm and dry.     Capillary Refill: Capillary refill takes less than 2 seconds.     Coloration: Skin is not jaundiced or pale.     Findings: No erythema.  Neurological:     General: No focal deficit present.     Mental Status: She is alert and oriented to person, place, and time. Mental status is at baseline.     Cranial Nerves: No cranial nerve deficit.     Sensory: Sensation is intact. No sensory deficit.     Motor: Motor function is intact. No weakness.     Coordination: Coordination is intact. Coordination normal.     Gait: Gait  is intact. Gait normal.  Psychiatric:        Mood and Affect: Mood normal.        Behavior: Behavior normal.        Thought Content: Thought content normal.        Judgment: Judgment normal.      Assessment & Plan:  1. Blepharospasm Chronic.  Will check electrolytes with kidney function and CBC to rule out metabolic cause.  Differentials include possible electrolyte imbalance - encouraged hydration with plenty of water, at least 64 oz daily.  Return to clinic if symptoms persist or become more bothersome.  Can consider nerve conduction study if continues and/or becomes more bothersome.  - COMPLETE METABOLIC PANEL WITH GFR - CBC with Differential     Follow up plan: Return if symptoms worsen or fail to improve.

## 2020-08-25 ENCOUNTER — Ambulatory Visit: Payer: BLUE CROSS/BLUE SHIELD | Admitting: Obstetrics and Gynecology

## 2020-08-31 NOTE — Progress Notes (Signed)
20 y.o. G0P0000 Single White or Caucasian Not Hispanic or Latino female here for annual exam.  Sexually active, same partner x 3 months. Using condoms.  Period Cycle (Days): 28 Period Duration (Days): 4-5 Period Pattern: Regular Menstrual Flow: Moderate Menstrual Control: Tampon Menstrual Control Change Freq (Hours): 4 Dysmenorrhea: None  Patient's last menstrual period was 08/15/2020.          Sexually active: Yes.    The current method of family planning is OCP (estrogen/progesterone).    Exercising: Yes.     Weight lifting  Smoker:  no  Health Maintenance: Pap:  none  History of abnormal Pap:  na  MMG:  left breast U/S 08/22/2018 Bi-rads 1 neg  BMD:   none  Colonoscopy: none  TDaP:  06/27/10  Gardasil: no declines    reports that she has never smoked. She has never used smokeless tobacco. She reports that she does not drink alcohol and does not use drugs. She plays basket ball for [employer], studying sports science. Graduates in 12/22. Plans to take a year off and then go to nursing school.   Past Medical History:  Diagnosis Date   Medical history non-contributory     Past Surgical History:  Procedure Laterality Date   ANTERIOR CRUCIATE LIGAMENT REPAIR Right 07/18/2017   Procedure: ANTERIOR CRUCIATE LIGAMENT (ACL) REPAIR and meniscal repair lateral and medial;  Surgeon: Vickki Hearing, MD;  Location: [facility];  Service: Orthopedics;  Laterality: Right;  on 07/18/17   NO PAST SURGERIES      Current Outpatient Medications  Medication Sig Dispense Refill   norethindrone-ethinyl estradiol (JUNEL FE 1/20) 1-20 MG-MCG tablet Take 1 tablet by mouth daily. 84 tablet 3   No current facility-administered medications for this visit.    Family History  Problem Relation Age of Onset   Diabetes Father    Heart disease Father    Cancer Father    Heart attack Father    Thyroid disease Mother     Review of Systems  All other systems reviewed and are negative.  Exam:    BP 122/64   Pulse 62   Ht 5\' 9"  (1.753 m)   Wt 145 lb (65.8 kg)   LMP 08/15/2020   SpO2 100%   BMI 21.41 kg/m   Weight change: @WEIGHTCHANGE @ Height:   Height: 5\' 9"  (175.3 cm)  Ht Readings from Last 3 Encounters:  09/01/20 5\' 9"  (1.753 m)  07/20/20 5\' 9"  (1.753 m)  08/20/19 5\' 8"  (1.727 m)    General appearance: alert, cooperative and appears stated age Head: Normocephalic, without obvious abnormality, atraumatic Neck: no adenopathy, supple, symmetrical, trachea midline and thyroid normal to inspection and palpation Lungs: clear to auscultation bilaterally Cardiovascular: regular rate and rhythm Breasts: normal appearance, no masses or tenderness Abdomen: soft, non-tender; non distended,  no masses,  no organomegaly Extremities: extremities normal, atraumatic, no cyanosis or edema Skin: Skin color, texture, turgor normal. No rashes or lesions Lymph nodes: Cervical, supraclavicular, and axillary nodes normal. No abnormal inguinal nodes palpated Neurologic: Grossly normal   Pelvic: External genitalia:  no lesions              Urethra:  normal appearing urethra with no masses, tenderness or lesions              Bartholins and Skenes: normal                 Vagina: normal appearing vagina with normal color and discharge, no lesions  Cervix: no lesions               Bimanual Exam:  Uterus:  normal size, contour, position, consistency, mobility, non-tender              Adnexa: no mass, fullness, tenderness               Rectovaginal: Confirms               Anus:  normal sphincter tone, no lesions    1. Well woman exam Discussed breast self exam Discussed calcium and vit D intake   2. Screening for cervical cancer  - Cytology - PAP  3. Screening examination for STD (sexually transmitted disease)  - RPR - HIV Antibody (routine testing w rflx) - Hepatitis C antibody - Cytology - PAP  4. Encounter for surveillance of contraceptive pills  -  norethindrone-ethinyl estradiol-FE (JUNEL FE 1/20) 1-20 MG-MCG tablet; Take 1 tablet by mouth daily.  Dispense: 84 tablet; Refill: 3  5. Immunization due -Declines gardasil - Tdap vaccine greater than or equal to 7yo IM

## 2020-09-01 ENCOUNTER — Other Ambulatory Visit: Payer: Self-pay

## 2020-09-01 ENCOUNTER — Other Ambulatory Visit (HOSPITAL_COMMUNITY)
Admission: RE | Admit: 2020-09-01 | Discharge: 2020-09-01 | Disposition: A | Discharge status: Home / Self Care | Payer: BC Managed Care – PPO | Source: Ambulatory Visit | Attending: Obstetrics and Gynecology | Admitting: Obstetrics and Gynecology

## 2020-09-01 ENCOUNTER — Encounter: Payer: Self-pay | Admitting: Obstetrics and Gynecology

## 2020-09-01 ENCOUNTER — Ambulatory Visit (INDEPENDENT_AMBULATORY_CARE_PROVIDER_SITE_OTHER): Payer: BC Managed Care – PPO | Admitting: Obstetrics and Gynecology

## 2020-09-01 VITALS — BP 122/64 | HR 62 | Ht 69.0 in | Wt 145.0 lb

## 2020-09-01 DIAGNOSIS — Z124 Encounter for screening for malignant neoplasm of cervix: Secondary | ICD-10-CM | POA: Diagnosis not present

## 2020-09-01 DIAGNOSIS — Z3041 Encounter for surveillance of contraceptive pills: Secondary | ICD-10-CM | POA: Diagnosis not present

## 2020-09-01 DIAGNOSIS — Z23 Encounter for immunization: Secondary | ICD-10-CM

## 2020-09-01 DIAGNOSIS — Z01419 Encounter for gynecological examination (general) (routine) without abnormal findings: Secondary | ICD-10-CM

## 2020-09-01 DIAGNOSIS — Z113 Encounter for screening for infections with a predominantly sexual mode of transmission: Secondary | ICD-10-CM

## 2020-09-01 MED ORDER — NORETHIN ACE-ETH ESTRAD-FE 1-20 MG-MCG PO TABS: 1.0000 | ORAL_TABLET | Freq: Every day | ORAL | 3 refills | Status: DC

## 2020-09-01 NOTE — Patient Instructions (Signed)

## 2020-09-02 LAB — CYTOLOGY - PAP
Chlamydia: NEGATIVE
Comment: NEGATIVE
Comment: NEGATIVE
Comment: NORMAL
Diagnosis: NEGATIVE
Neisseria Gonorrhea: NEGATIVE
Trichomonas: NEGATIVE

## 2020-09-02 LAB — HIV ANTIBODY (ROUTINE TESTING W REFLEX): HIV 1&2 Ab, 4th Generation: NONREACTIVE

## 2020-09-02 LAB — HEPATITIS C ANTIBODY
Hepatitis C Ab: NONREACTIVE
SIGNAL TO CUT-OFF: 0 (ref <1.00)

## 2020-09-02 LAB — RPR: RPR Ser Ql: NONREACTIVE

## 2020-09-06 ENCOUNTER — Other Ambulatory Visit: Payer: Self-pay

## 2020-09-06 MED ORDER — FLUCONAZOLE 150 MG PO TABS
ORAL_TABLET | ORAL | 0 refills | Status: DC
Start: 2020-09-06 — End: 2020-11-18

## 2020-11-17 DIAGNOSIS — G43909 Migraine, unspecified, not intractable, without status migrainosus: Secondary | ICD-10-CM | POA: Diagnosis not present

## 2020-11-18 ENCOUNTER — Other Ambulatory Visit: Payer: Self-pay

## 2020-11-18 ENCOUNTER — Ambulatory Visit: Payer: BC Managed Care – PPO | Admitting: Nurse Practitioner

## 2020-11-18 ENCOUNTER — Encounter: Payer: Self-pay | Admitting: Nurse Practitioner

## 2020-11-18 VITALS — BP 118/72 | HR 69 | Temp 99.0°F | Resp 17 | Ht 69.0 in | Wt 145.0 lb

## 2020-11-18 DIAGNOSIS — H9201 Otalgia, right ear: Secondary | ICD-10-CM

## 2020-11-18 DIAGNOSIS — R519 Headache, unspecified: Secondary | ICD-10-CM | POA: Diagnosis not present

## 2020-11-18 MED ORDER — AZITHROMYCIN 250 MG PO TABS: 250.0000 mg | ORAL_TABLET | Freq: Every day | ORAL | 0 refills | Status: DC

## 2020-11-18 MED ORDER — CETIRIZINE HCL 10 MG PO TABS: 10.0000 mg | ORAL_TABLET | Freq: Every day | ORAL | 11 refills | Status: AC

## 2020-11-18 MED ORDER — FLUTICASONE PROPIONATE 50 MCG/ACT NA SUSP: 2.0000 | Freq: Every day | NASAL | 6 refills | Status: DC

## 2020-11-18 NOTE — Progress Notes (Signed)
Subjective:    Patient ID: Kayla May, female    DOB: 07/29/2000, 20 y.o.   MRN: [id number]  HPI: Kayla May is a 20 y.o. female presenting for headache and ear pain.  Chief Complaint  Patient presents with   Facial Swelling    Right side of her neck, has had headache and ear pain, seen at [facility] no [facility], denies fevers   HEADACHE Patient reports she went to urgent care and was given Imitrex for the headache.  She reports the Imitrex made her feel tingly and did not help with the headache.  She is wondering if she is having sinus headaches.  Duration: 2 weeks Onset: gradual Severity: moderate to severe Frequency: daily Location: right side Headache duration: hours Radiation: no Time of day headache occurs: random Alleviating factors: ibuprofen Aggravating factors: no Headache status at time of visit: current headache - mild Treatments attempted: ibuprofen   Aura: no Nausea:  no Vomiting: no Photophobia:  yes Phonophobia:  no Effect on social functioning:  no Numbers of missed days of school/work each month: 0 Confusion:  no Gait disturbance/ataxia:  no Behavioral changes:  no Fevers:  no  UPPER RESPIRATORY TRACT INFECTION Patient reports she has also had ear pain and pressure in the right side of her face.   Fever: no Cough: no Shortness of breath: no Wheezing: no Chest pain: no Chest tightness: no Chest congestion: no Nasal congestion: no Runny nose: no Post nasal drip: no Sneezing: no Sore throat: no Swollen glands: yes Sinus pressure: no Headache: yes Face pain: no Toothache: no Ear pain: yes; right side Ear pressure: no  Eyes red/itching:no Eye drainage/crusting: no  Nausea: no  Vomiting: no Diarrhea: no  Change in appetite: no  Loss of taste/smell: no  Rash: no Fatigue: no Sick contacts: no Strep contacts: no  Context: stable Recurrent sinusitis: no Treatments attempted: ibuprofen Relief with OTC medications: yes  Allergies   Allergen Reactions   Penicillins Swelling and Other (See Comments)    Has patient had a PCN reaction causing immediate rash, facial/tongue/throat swelling, SOB or lightheadedness with hypotension: Yes Has patient had a PCN reaction causing severe rash involving mucus membranes or skin necrosis: No Has patient had a PCN reaction that required hospitalization: No Has patient had a PCN reaction occurring within the last 10 years: No If all of the above answers are "NO", then may proceed with Cephalosporin use.    Bee Venom Hives   Blueberry [Vaccinium Angustifolium] Swelling    Outpatient Encounter Medications as of 11/18/2020  Medication Sig   fluconazole (DIFLUCAN) 150 MG tablet Take one tab po. Repeat one tab po in 72 hours is still with symptoms.   norethindrone-ethinyl estradiol-FE (JUNEL FE 1/20) 1-20 MG-MCG tablet Take 1 tablet by mouth daily.   SUMAtriptan (IMITREX) 50 MG tablet Take by mouth.   No facility-administered encounter medications on file as of 11/18/2020.    Patient Active Problem List   Diagnosis Date Noted   Family history of diabetes mellitus (DM) 08/19/2018   S/P ACL repair right knee 07/18/17    Tear of lateral meniscus of right knee, current     Past Medical History:  Diagnosis Date   Medical history non-contributory     Relevant past medical, surgical, family and social history reviewed and updated as indicated. Interim medical history since our last visit reviewed.  Review of Systems Per HPI unless specifically indicated above     Objective:    BP  118/72   Pulse 69   Temp 99 F (37.2 C) (Oral)   Resp 17   Ht 5\' 9"  (1.753 m)   Wt 145 lb (65.8 kg)   SpO2 99%   BMI 21.41 kg/m   Wt Readings from Last 3 Encounters:  11/18/20 145 lb (65.8 kg)  09/01/20 145 lb (65.8 kg)  07/20/20 147 lb 12.8 oz (67 kg)    Physical Exam Vitals and nursing note reviewed.  Constitutional:      General: She is not in acute distress.    Appearance: Normal  appearance. She is not toxic-appearing.  HENT:     Head: Normocephalic and atraumatic.     Right Ear: A middle ear effusion is present. Tympanic membrane is not injected, perforated or erythematous.     Left Ear: Tympanic membrane, ear canal and external ear normal.     Nose: Congestion present. No rhinorrhea.     Mouth/Throat:     Mouth: Mucous membranes are moist.     Pharynx: Oropharynx is clear. Posterior oropharyngeal erythema present.  Eyes:     General: No scleral icterus.    Extraocular Movements: Extraocular movements intact.  Musculoskeletal:     Cervical back: Normal range of motion.  Lymphadenopathy:     Cervical: No cervical adenopathy.  Skin:    General: Skin is warm and dry.     Coloration: Skin is not jaundiced or pale.     Findings: No erythema.  Neurological:     Mental Status: She is alert and oriented to person, place, and time.     Motor: No weakness.     Gait: Gait normal.  Psychiatric:        Mood and Affect: Mood normal.        Behavior: Behavior normal.        Thought Content: Thought content normal.        Judgment: Judgment normal.      Assessment & Plan:  1. Right ear pain Acute.  The right ear is not acute infected today, however I do see some fluid behind the tympanic membrane.  I discussed this with the patient and her mother via the patient's cell phone.  Mother adamant that the patient had issues with this when she was younger and always needed amoxicillin.  However, it looks like the patient is allergic to penicillins.  I explained that I do not think that the patient needs antibiotics today.  I requested that the patient start flonase and ceritizine and if no better in 2 days, she can start azithromycin.  Patient and mother verbalized understanding and agreement to this plan.  - azithromycin (ZITHROMAX) 250 MG tablet; Take 1 tablet (250 mg total) by mouth daily. Take first 2 tablets together, then 1 every day until finished.  Dispense: 6 tablet;  Refill: 0  2. Sinus headache Suspect this may be related to allergies and sinus pressure.  Start flonase and cetirizine.  Follow up if no improvement after treatment.  - fluticasone (FLONASE) 50 MCG/ACT nasal spray; Place 2 sprays into both nostrils daily.  Dispense: 16 g; Refill: 6 - cetirizine (ZYRTEC) 10 MG tablet; Take 1 tablet (10 mg total) by mouth daily.  Dispense: 30 tablet; Refill: 11    Follow up plan: No follow-ups on file.

## 2020-12-27 ENCOUNTER — Telehealth: Payer: Self-pay | Admitting: Nurse Practitioner

## 2020-12-27 ENCOUNTER — Other Ambulatory Visit: Payer: Self-pay

## 2020-12-27 ENCOUNTER — Emergency Department (HOSPITAL_BASED_OUTPATIENT_CLINIC_OR_DEPARTMENT_OTHER)
Admission: EM | Admit: 2020-12-27 | Discharge: 2020-12-27 | Disposition: A | Discharge status: Home / Self Care | Payer: BC Managed Care – PPO | Attending: Emergency Medicine | Admitting: Emergency Medicine

## 2020-12-27 ENCOUNTER — Encounter (HOSPITAL_BASED_OUTPATIENT_CLINIC_OR_DEPARTMENT_OTHER): Payer: Self-pay

## 2020-12-27 ENCOUNTER — Emergency Department (HOSPITAL_BASED_OUTPATIENT_CLINIC_OR_DEPARTMENT_OTHER): Payer: BC Managed Care – PPO | Admitting: Radiology

## 2020-12-27 DIAGNOSIS — R079 Chest pain, unspecified: Secondary | ICD-10-CM | POA: Diagnosis not present

## 2020-12-27 DIAGNOSIS — R072 Precordial pain: Secondary | ICD-10-CM | POA: Diagnosis not present

## 2020-12-27 DIAGNOSIS — R9431 Abnormal electrocardiogram [ECG] [EKG]: Secondary | ICD-10-CM | POA: Diagnosis not present

## 2020-12-27 DIAGNOSIS — R0789 Other chest pain: Secondary | ICD-10-CM | POA: Diagnosis not present

## 2020-12-27 LAB — BASIC METABOLIC PANEL
Anion gap: 8 (ref 5–15)
BUN: 11 mg/dL (ref 6–20)
CO2: 28 mmol/L (ref 22–32)
Calcium: 9.4 mg/dL (ref 8.9–10.3)
Chloride: 103 mmol/L (ref 98–111)
Creatinine, Ser: 0.67 mg/dL (ref 0.44–1.00)
GFR, Estimated: >60 mL/min (ref >60)
Glucose, Bld: 110 mg/dL — ABNORMAL HIGH (ref 70–99)
Potassium: 3.5 mmol/L (ref 3.5–5.1)
Sodium: 139 mmol/L (ref 135–145)

## 2020-12-27 LAB — CBC
HCT: 37.5 % (ref 36.0–46.0)
Hemoglobin: 13.1 g/dL (ref 12.0–15.0)
MCH: 32.1 pg (ref 26.0–34.0)
MCHC: 34.9 g/dL (ref 30.0–36.0)
MCV: 91.9 fL (ref 80.0–100.0)
Platelets: 295 10*3/uL (ref 150–400)
RBC: 4.08 MIL/uL (ref 3.87–5.11)
RDW: 11.8 % (ref 11.5–15.5)
WBC: 8.4 10*3/uL (ref 4.0–10.5)
nRBC: 0 % (ref 0.0–0.2)

## 2020-12-27 LAB — TROPONIN I (HIGH SENSITIVITY): Troponin I (High Sensitivity): <2 ng/L (ref <18)

## 2020-12-27 LAB — PREGNANCY, URINE: Preg Test, Ur: NEGATIVE

## 2020-12-27 MED ORDER — PANTOPRAZOLE SODIUM 40 MG PO TBEC
40.0000 mg | DELAYED_RELEASE_TABLET | Freq: Once | ORAL | Status: AC
  Administered 2020-12-27: 21:00:00 40 mg via ORAL
  Filled 2020-12-27: qty 1

## 2020-12-27 NOTE — ED Provider Notes (Signed)
[facility] EMERGENCY DEPT Provider Note   CSN: [id number] Arrival date & time: 12/27/20  4431     History Chief Complaint  Patient presents with   Chest Pain    Kayla May is a 20 y.o. female.  She has no significant past medical history.  She is having some stabbing central chest pain that is been going on for few days.  Improves with Tums but comes back.  No shortness of breath diaphoresis nausea vomiting cough.  Did a home COVID test which was negative.  Went to [facility] where they did an E[facility] and were concerned about possible electrolyte imbalance.  Patient states she has been eating and drinking well.  No family history of young heart disease.  The history is provided by the patient.  Chest Pain Pain location:  Substernal area Pain quality: stabbing   Pain radiates to:  Does not radiate Pain severity:  Moderate Onset quality:  Gradual Duration:  4 days Timing:  Intermittent Progression:  Unchanged Chronicity:  New Relieved by:  Nothing Worsened by:  Nothing Ineffective treatments:  Antacids Associated symptoms: no abdominal pain, no back pain, no cough, no diaphoresis, no dysphagia, no fever, no headache, no nausea, no shortness of breath and no vomiting   Risk factors: not pregnant and no smoking       Past Medical History:  Diagnosis Date   Medical history non-contributory     Patient Active Problem List   Diagnosis Date Noted   Family history of diabetes mellitus (DM) 08/19/2018   S/P ACL repair right knee 07/18/17    Tear of lateral meniscus of right knee, current     Past Surgical History:  Procedure Laterality Date   ANTERIOR CRUCIATE LIGAMENT REPAIR Right 07/18/2017   Procedure: ANTERIOR CRUCIATE LIGAMENT (ACL) REPAIR and meniscal repair lateral and medial;  Surgeon: Vickki Hearing, MD;  Location: [facility];  Service: Orthopedics;  Laterality: Right;  on 07/18/17   NO PAST SURGERIES       OB History     Gravida  0   Para  0    Term  0   Preterm  0   AB  0   Living  0      SAB  0   IAB  0   Ectopic  0   Multiple  0   Live Births  0           Family History  Problem Relation Age of Onset   Diabetes Father    Heart disease Father    Cancer Father    Heart attack Father    Thyroid disease Mother     Social History   Tobacco Use   Smoking status: Never   Smokeless tobacco: Never  Vaping Use   Vaping Use: Never used  Substance Use Topics   Alcohol use: No   Drug use: No    Home Medications Prior to Admission medications   Medication Sig Start Date End Date Taking? Authorizing Provider  azithromycin (ZITHROMAX) 250 MG tablet Take 1 tablet (250 mg total) by mouth daily. Take first 2 tablets together, then 1 every day until finished. 11/18/20   Valentino Nose, NP  cetirizine (ZYRTEC) 10 MG tablet Take 1 tablet (10 mg total) by mouth daily. 11/18/20   Valentino Nose, NP  fluticasone (FLONASE) 50 MCG/ACT nasal spray Place 2 sprays into both nostrils daily. 11/18/20   Valentino Nose, NP  norethindrone-ethinyl estradiol-FE (JUNEL FE 1/20)  1-20 MG-MCG tablet Take 1 tablet by mouth daily. 09/01/20   Romualdo Bolk, MD    Allergies    Penicillins, Bee venom, and Blueberry [vaccinium angustifolium]  Review of Systems   Review of Systems  Constitutional:  Negative for diaphoresis and fever.  HENT:  Negative for trouble swallowing.   Eyes:  Negative for visual disturbance.  Respiratory:  Negative for cough and shortness of breath.   Cardiovascular:  Positive for chest pain.  Gastrointestinal:  Negative for abdominal pain, nausea and vomiting.  Genitourinary:  Negative for dysuria.  Musculoskeletal:  Negative for back pain.  Skin:  Negative for rash.  Neurological:  Negative for headaches.   Physical Exam Updated Vital Signs BP 135/84 (BP Location: Right Arm)    Pulse 86    Temp 98.3 F (36.8 C)    Resp 19    Ht 5\' 9"  (1.753 m)    Wt 65.8 kg    LMP 11/29/2020     SpO2 100%    BMI 21.41 kg/m   Physical Exam Vitals and nursing note reviewed.  Constitutional:      General: She is not in acute distress.    Appearance: Normal appearance. She is well-developed.  HENT:     Head: Normocephalic and atraumatic.  Eyes:     Conjunctiva/sclera: Conjunctivae normal.  Cardiovascular:     Rate and Rhythm: Normal rate and regular rhythm.     Pulses: Normal pulses.     Heart sounds: Normal heart sounds. No murmur heard. Pulmonary:     Effort: Pulmonary effort is normal. No respiratory distress.     Breath sounds: Normal breath sounds.  Abdominal:     Palpations: Abdomen is soft.     Tenderness: There is no abdominal tenderness. There is no guarding or rebound.  Musculoskeletal:        General: No swelling. Normal range of motion.     Cervical back: Neck supple.     Right lower leg: No tenderness. No edema.     Left lower leg: No tenderness. No edema.  Skin:    General: Skin is warm and dry.     Capillary Refill: Capillary refill takes less than 2 seconds.  Neurological:     General: No focal deficit present.     Mental Status: She is alert.     Gait: Gait normal.  Psychiatric:        Mood and Affect: Mood normal.    ED Results / Procedures / Treatments   Labs (all labs ordered are listed, but only abnormal results are displayed) Labs Reviewed  BASIC METABOLIC PANEL - Abnormal; Notable for the following components:      Result Value   Glucose, Bld 110 (*)    All other components within normal limits  CBC  PREGNANCY, URINE  TROPONIN I (HIGH SENSITIVITY)    EKG EKG Interpretation  Date/Time:  Tuesday December 27 2020 19:25:30 EST Ventricular Rate:  81 PR Interval:  120 QRS Duration: 96 QT Interval:  336 QTC Calculation: 390 R Axis:   81 Text Interpretation: Normal sinus rhythm Lateral infarct , age undetermined Abnormal ECG No old tracing to compare Confirmed by [id number]) on 12/27/2020 7:29:08 PM  Radiology DG Chest 2  View  Result Date: 12/27/2020 CLINICAL DATA:  Chest pain. EXAM: CHEST - 2 VIEW COMPARISON:  None. FINDINGS: The heart size and mediastinal contours are within normal limits. Both lungs are clear. The visualized skeletal structures are unremarkable. IMPRESSION: No active  cardiopulmonary disease. Electronically Signed   By: Darliss Cheney M.D.   On: 12/27/2020 19:41    Procedures Procedures   Medications Ordered in ED Medications - No data to display  ED Course  I have reviewed the triage vital signs and the nursing notes.  Pertinent labs & imaging results that were available during my care of the patient were reviewed by me and considered in my medical decision making (see chart for details).    MDM Rules/Calculators/A&P                         This patient complains of intermittent stabbing chest pain; this involves an extensive number of treatment Options and is a complaint that carries with it a high risk of complications and Morbidity. The differential includes ACS, pneumonia, PE, pneumothorax's, GERD, musculoskeletal  I ordered, reviewed and interpreted labs, which included CBC with normal white count normal hemoglobin, chemistries normal, troponin unremarkable, pregnancy test negative I ordered medication Protonix I ordered imaging studies which included chest x-ray and I independently    visualized and interpreted imaging which showed no acute findings Additional history obtained from patient's mother Previous records obtained and reviewed in epic no recent admissions After the interventions stated above, I reevaluated the patient and found patient to be resting comfortably hemodynamically stable satting under percent on room air.  No indications for further testing at this time.  Recommended starting on a PPI.  Follow-up with PCP.     Final Clinical Impression(s) / ED Diagnoses Final diagnoses:  Nonspecific chest pain    Rx / DC Orders ED Discharge Orders     None         Terrilee Files, MD 12/28/20 1126

## 2020-12-27 NOTE — ED Triage Notes (Signed)
Patient has been having centralized chest pain for several days, worsening today. She was seen at [facility] and and EKG was done, they are concerned about an electrolyte imbalance. Patient denies n/v/d. NAD noted in triage.

## 2020-12-27 NOTE — Telephone Encounter (Signed)
Spoke with pt and she states she has been having a sensation of burning and chest pain for several days. She took an at-home COVID test and it was negative. Pt reports she took some Tums and her symptoms improved slightly but returned quickly. Advised pt she needs evaluation sooner rather than later and recommended she go to UC today. Pt voiced understanding and states she will go to UC. Appt for Friday canceled.

## 2020-12-27 NOTE — Discharge Instructions (Signed)
You were seen in the emergency department for evaluation of chest pain.  You had blood work EKG and a chest x-ray that did not show an obvious explanation for your symptoms.  This is likely due to gastroesophageal reflux or GERD.  Please use acid medication such as Nexium or other over-the-counter similar that your pharmacist can direct you to.  You can also use Tums or Maalox in between meals and at bedtime.  Follow-up with your doctor.  Return to the emergency department for any worsening or concerning symptoms

## 2020-12-27 NOTE — Telephone Encounter (Signed)
Patient's mother Darel Hong called to schedule appt for patient to see provider; having strange chest pains and unsure of cause (mentioned poss pulled muscle/heartburn?). Patient is at work; advised Darel Hong to have patient call to speak with nurse for advisement since appointment isn't until Friday (patient not available until then).

## 2020-12-30 ENCOUNTER — Ambulatory Visit: Payer: BC Managed Care – PPO | Admitting: Nurse Practitioner

## 2021-02-03 ENCOUNTER — Ambulatory Visit: Payer: BC Managed Care – PPO | Admitting: Nurse Practitioner

## 2021-02-03 ENCOUNTER — Other Ambulatory Visit: Payer: Self-pay

## 2021-02-03 ENCOUNTER — Encounter: Payer: Self-pay | Admitting: Nurse Practitioner

## 2021-02-03 VITALS — BP 118/70 | HR 77 | Ht 69.0 in | Wt 148.0 lb

## 2021-02-03 DIAGNOSIS — L659 Nonscarring hair loss, unspecified: Secondary | ICD-10-CM

## 2021-02-03 NOTE — Progress Notes (Signed)
Subjective:    Patient ID: Kayla May, female    DOB: Mar 19, 2000, 21 y.o.   MRN: [id number]  HPI: Kayla May is a 21 y.o. female presenting for hair thinning.  Chief Complaint  Patient presents with   Alopecia   Patient reports her mother has hypothyroidism.    HAIR THINNING Duration: months Onset: sudden Location/pattern: diffuse Severity: moderate Scarring:  no   Erythema:  no Scaling/flaking:  no Itching:  no Recent stressful event:  no Recent illness/surgery:  no Recent weight loss/dietary change: no Cold intolerance: yes; this is normal Heat intolerance: no Constipation: no Diarrhea: no Hair pulling habit:  no Hair styling with traction:  no New medications:  no Family history of hair loss:  no Status: stable Palpitations: yes, feels panicky and like and feels like she can't catch breath Treatments attempted: relaxation, time, hair protection shampoo  Allergies  Allergen Reactions   Penicillins Swelling and Other (See Comments)    Has patient had a PCN reaction causing immediate rash, facial/tongue/throat swelling, SOB or lightheadedness with hypotension: Yes Has patient had a PCN reaction causing severe rash involving mucus membranes or skin necrosis: No Has patient had a PCN reaction that required hospitalization: No Has patient had a PCN reaction occurring within the last 10 years: No If all of the above answers are "NO", then may proceed with Cephalosporin use.    Bee Venom Hives   Blueberry [Vaccinium Angustifolium] Swelling    Outpatient Encounter Medications as of 02/03/2021  Medication Sig   cetirizine (ZYRTEC) 10 MG tablet Take 1 tablet (10 mg total) by mouth daily.   fluticasone (FLONASE) 50 MCG/ACT nasal spray Place 2 sprays into both nostrils daily.   norethindrone-ethinyl estradiol-FE (JUNEL FE 1/20) 1-20 MG-MCG tablet Take 1 tablet by mouth daily.   [DISCONTINUED] azithromycin (ZITHROMAX) 250 MG tablet Take 1 tablet (250 mg total)  by mouth daily. Take first 2 tablets together, then 1 every day until finished.   No facility-administered encounter medications on file as of 02/03/2021.    Patient Active Problem List   Diagnosis Date Noted   Family history of diabetes mellitus (DM) 08/19/2018   S/P ACL repair right knee 07/18/17    Tear of lateral meniscus of right knee, current     Past Medical History:  Diagnosis Date   Medical history non-contributory     Relevant past medical, surgical, family and social history reviewed and updated as indicated. Interim medical history since our last visit reviewed.  Review of Systems Per HPI unless specifically indicated above     Objective:    BP 118/70    Pulse 77    Ht 5\' 9"  (1.753 m)    Wt 148 lb (67.1 kg)    SpO2 98%    BMI 21.86 kg/m   Wt Readings from Last 3 Encounters:  02/03/21 148 lb (67.1 kg)  12/27/20 145 lb (65.8 kg)  11/18/20 145 lb (65.8 kg)    Physical Exam Vitals and nursing note reviewed.  Constitutional:      General: She is not in acute distress.    Appearance: Normal appearance. She is not toxic-appearing.  HENT:     Head: Normocephalic and atraumatic.     Comments: No patches of missing hair appreciated, no skin changes to scalp.  Hair does not appear brittle.    Mouth/Throat:     Mouth: Mucous membranes are moist.     Pharynx: Oropharynx is clear.  Eyes:  General: No scleral icterus.       Right eye: No discharge.     Extraocular Movements: Extraocular movements intact.     Conjunctiva/sclera:     Right eye: Right conjunctiva is not injected. No exudate or hemorrhage.    Left eye: Left conjunctiva is not injected. No exudate or hemorrhage. Neck:     Thyroid: No thyroid mass, thyromegaly or thyroid tenderness.  Cardiovascular:     Rate and Rhythm: Normal rate and regular rhythm.     Heart sounds: Normal heart sounds. No murmur heard. Musculoskeletal:     Cervical back: Full passive range of motion without pain.  Skin:     General: Skin is warm and dry.     Coloration: Skin is not jaundiced or pale.     Findings: No erythema.  Neurological:     Mental Status: She is alert and oriented to person, place, and time.     Motor: No weakness.     Gait: Gait normal.  Psychiatric:        Mood and Affect: Mood normal.        Behavior: Behavior normal.        Thought Content: Thought content normal.        Judgment: Judgment normal.      Assessment & Plan:  1. Hair thinning Acute.  Given recent onset and family history of hypothyroidism, check TSH with panel today.  Discussed tactics to prevent thinning hair in the future; wear loose hair styles, drink plenty of water and eat whole foods, etc.   - Thyroid Panel With TSH     Follow up plan: Return if symptoms worsen or fail to improve.

## 2021-02-04 LAB — THYROID PANEL WITH TSH
Free Thyroxine Index: 2 (ref 1.4–3.8)
T3 Uptake: 30 % (ref 22–35)
T4, Total: 6.8 ug/dL (ref 5.1–11.9)
TSH: 1.51 mIU/L

## 2021-02-08 IMAGING — US ULTRASOUND LEFT BREAST LIMITED
1 series · 2 of 2 positions shown · non-contrast
Comparison: None.

CLINICAL DATA: Patient's referring clinician reports a lump along
the medial aspect of the left breast. The patient does not feel
this.

EXAM:
ULTRASOUND OF THE LEFT BREAST

[Series 1: ultrasound left breast limited · 0.06mm/px · 2 of 2 slices shown]
[im 1/2]
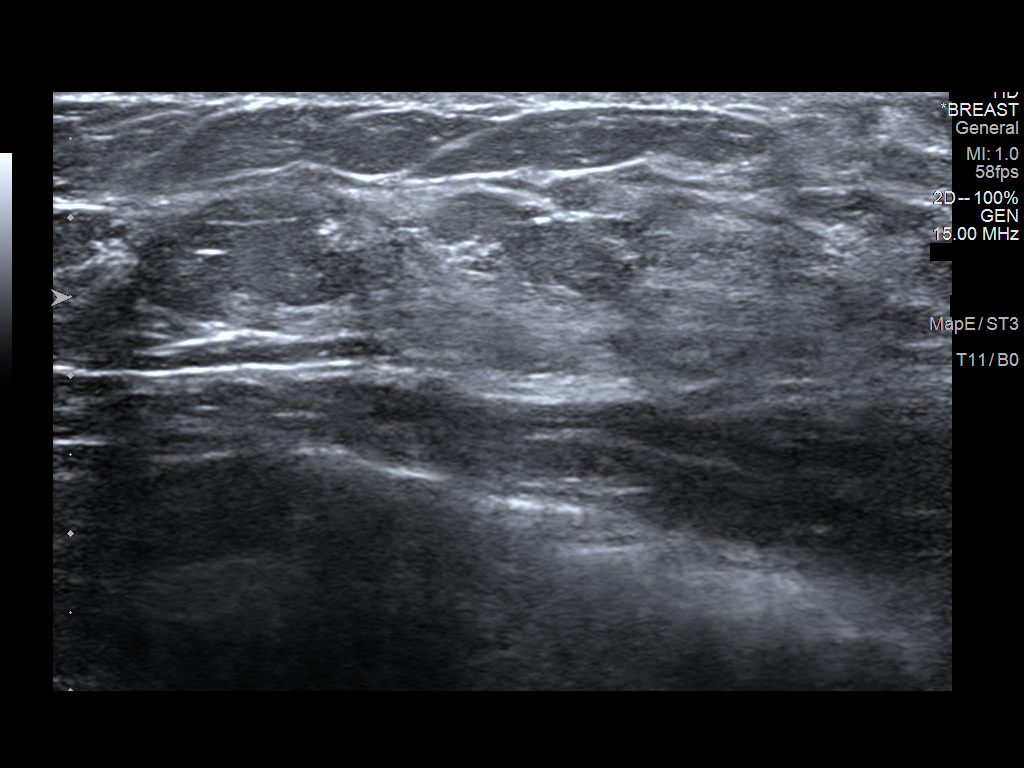
[im 2/2]
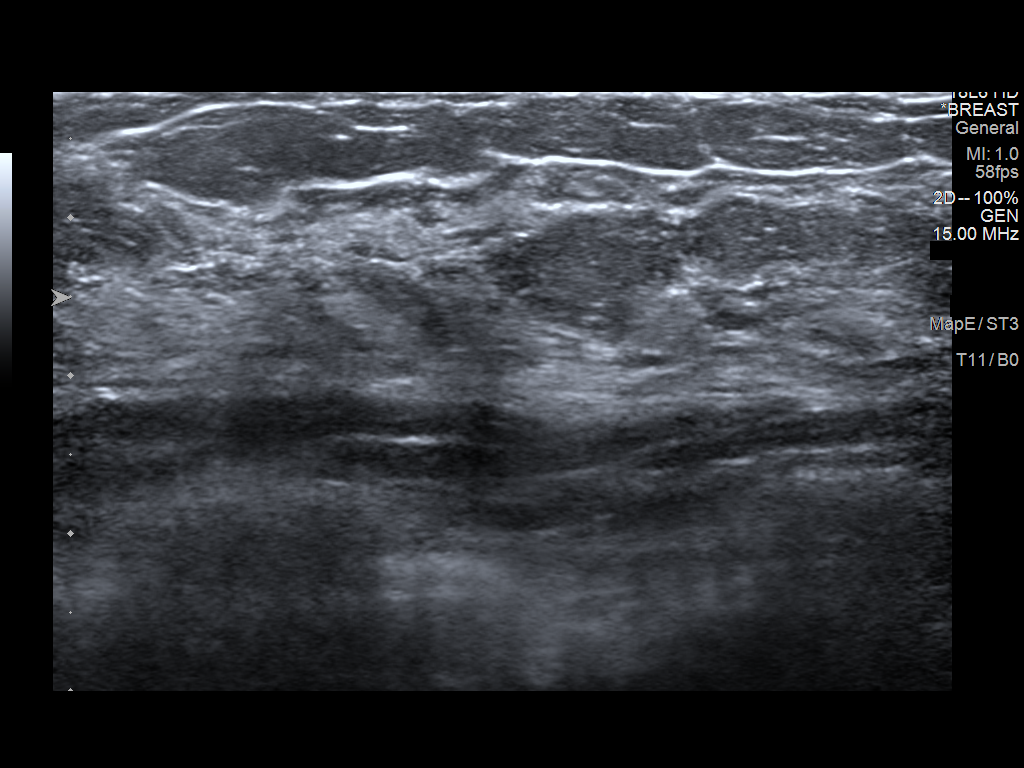

[2 of 2 positions shown; findings below may reference images not displayed]

FINDINGS: On physical exam, no mass is palpated along the medial aspect of
breast.

Targeted ultrasound is performed, showing normal fibroglandular
tissue throughout the medial left breast. No mass or suspicious
lesion.
IMPRESSION: Negative exam.  No evidence of breast malignancy.

RECOMMENDATION:
Screening mammogram at age 39 unless there are persistent or
intervening clinical concerns. (Code:[id number])

I have discussed the findings and recommendations with the patient.
Results were also provided in writing at the conclusion of the
visit. If applicable, a reminder letter will be sent to the patient
regarding the next appointment.

BI-RADS CATEGORY  1: Negative.

## 2021-02-27 DIAGNOSIS — M940 Chondrocostal junction syndrome [Tietze]: Secondary | ICD-10-CM | POA: Diagnosis not present

## 2021-02-27 DIAGNOSIS — R0781 Pleurodynia: Secondary | ICD-10-CM | POA: Diagnosis not present

## 2021-02-27 DIAGNOSIS — J019 Acute sinusitis, unspecified: Secondary | ICD-10-CM | POA: Diagnosis not present

## 2021-02-27 DIAGNOSIS — J069 Acute upper respiratory infection, unspecified: Secondary | ICD-10-CM | POA: Diagnosis not present

## 2021-04-24 DIAGNOSIS — J029 Acute pharyngitis, unspecified: Secondary | ICD-10-CM | POA: Diagnosis not present

## 2021-04-24 DIAGNOSIS — R509 Fever, unspecified: Secondary | ICD-10-CM | POA: Diagnosis not present

## 2021-06-01 ENCOUNTER — Encounter: Payer: Self-pay | Admitting: Obstetrics and Gynecology

## 2021-06-02 DIAGNOSIS — N390 Urinary tract infection, site not specified: Secondary | ICD-10-CM | POA: Diagnosis not present

## 2021-06-22 DIAGNOSIS — J020 Streptococcal pharyngitis: Secondary | ICD-10-CM | POA: Diagnosis not present

## 2021-07-18 ENCOUNTER — Encounter: Payer: Self-pay | Admitting: Nurse Practitioner

## 2021-07-18 ENCOUNTER — Ambulatory Visit: Payer: BC Managed Care – PPO | Admitting: Nurse Practitioner

## 2021-07-18 VITALS — BP 111/65 | HR 64 | Ht 69.0 in | Wt 149.0 lb

## 2021-07-18 DIAGNOSIS — R079 Chest pain, unspecified: Secondary | ICD-10-CM | POA: Diagnosis not present

## 2021-07-18 DIAGNOSIS — R1013 Epigastric pain: Secondary | ICD-10-CM | POA: Diagnosis not present

## 2021-07-18 DIAGNOSIS — J302 Other seasonal allergic rhinitis: Secondary | ICD-10-CM | POA: Diagnosis not present

## 2021-07-18 MED ORDER — PANTOPRAZOLE SODIUM 40 MG PO TBEC: 40.0000 mg | DELAYED_RELEASE_TABLET | Freq: Every day | ORAL | 3 refills | Status: AC

## 2021-07-18 NOTE — Progress Notes (Signed)
New Patient Office Visit  Subjective    Patient ID: Kayla May, female    DOB: 03/03/2000  Age: 21 y.o. MRN: [id number]  CC:  Chief Complaint  Patient presents with   New Patient (Initial Visit)    np   Chest Pain    Occasionally comes and goes since 03/2021    HPI JACKELYNE SAYER with past medical history of seasonal allergies presents to establish care.  Previous patient of Cathlean Marseilles, NP  Patient complains of intermittent chest pain since about 7 months ago, she stated that her chest pain is dull and annoying pain rated 3-8/10.  Chest pain is located to the center of her chest, pain is never unbearable occurring at least once a week.  pain sometimes last 1 to 2 hours.  Patient denies shortness of breath, wheezing, diaphoresis, dizziness.  She has tried taking Tums but she continues to have chest pain.  She has family history of heart disease.  Patient denies use of illicit drugs, fatty fried foods, alcohol, tobacco.    Outpatient Encounter Medications as of 07/18/2021  Medication Sig   cetirizine (ZYRTEC) 10 MG tablet Take 1 tablet (10 mg total) by mouth daily.   fluticasone (FLONASE) 50 MCG/ACT nasal spray Place 2 sprays into both nostrils daily.   norethindrone-ethinyl estradiol-FE (JUNEL FE 1/20) 1-20 MG-MCG tablet Take 1 tablet by mouth daily.   pantoprazole (PROTONIX) 40 MG tablet Take 1 tablet (40 mg total) by mouth daily.   No facility-administered encounter medications on file as of 07/18/2021.    Past Medical History:  Diagnosis Date   Medical history non-contributory     Past Surgical History:  Procedure Laterality Date   ANTERIOR CRUCIATE LIGAMENT REPAIR Right 07/18/2017   Procedure: ANTERIOR CRUCIATE LIGAMENT (ACL) REPAIR and meniscal repair lateral and medial;  Surgeon: Vickki Hearing, MD;  Location: [facility];  Service: Orthopedics;  Laterality: Right;  on 07/18/17   NO PAST SURGERIES      Family History  Problem Relation Age of Onset   Thyroid  disease Mother    Diabetes Father    Heart disease Father    Cancer Father    Heart attack Father        at age 6   Prostate cancer Father    Thyroid disease Brother    Alzheimer's disease Paternal Uncle    Heart disease Paternal Grandfather    Breast cancer Neg Hx    Colon cancer Neg Hx     Social History   Socioeconomic History   Marital status: Single    Spouse name: Not on file   Number of children: Not on file   Years of education: Not on file   Highest education level: Not on file  Occupational History   Not on file  Tobacco Use   Smoking status: Never   Smokeless tobacco: Never  Vaping Use   Vaping Use: Never used  Substance and Sexual Activity   Alcohol use: No   Drug use: No   Sexual activity: Not Currently    Birth control/protection: Pill  Other Topics Concern   Not on file  Social History Narrative   Lives with her parents    Social Determinants of Health   Financial Resource Strain: Not on file  Food Insecurity: Not on file  Transportation Needs: Not on file  Physical Activity: Not on file  Stress: Not on file  Social Connections: Not on file  Intimate Partner Violence: Not on  file    Review of Systems  Constitutional: Negative.  Negative for chills, fever, malaise/fatigue and weight loss.  Respiratory:  Negative for cough, hemoptysis, sputum production, shortness of breath and wheezing.   Cardiovascular:  Negative for chest pain, palpitations, orthopnea, claudication and leg swelling.  Gastrointestinal:  Negative for abdominal pain, blood in stool, constipation, diarrhea, melena, nausea and vomiting.  Genitourinary:  Negative for dysuria, flank pain, frequency, hematuria and urgency.  Psychiatric/Behavioral:  Negative for depression, hallucinations, substance abuse and suicidal ideas. The patient is not nervous/anxious and does not have insomnia.         Objective    BP 111/65 (BP Location: Right Arm, Patient Position: Sitting, Cuff Size:  Normal)   Pulse 64   Ht 5\' 9"  (1.753 m)   Wt 149 lb (67.6 kg)   LMP 07/18/2021 (Exact Date)   SpO2 98%   BMI 22.00 kg/m   Physical Exam Constitutional:      General: She is not in acute distress.    Appearance: She is well-developed. She is not ill-appearing, toxic-appearing or diaphoretic.  Cardiovascular:     Rate and Rhythm: Normal rate and regular rhythm. No extrasystoles are present.    Chest Wall: PMI is not displaced.     Heart sounds: Heart sounds not distant. No murmur heard.    No systolic murmur is present.     No friction rub.  Pulmonary:     Effort: No tachypnea, accessory muscle usage or respiratory distress.     Breath sounds: Normal breath sounds. No stridor.  Chest:     Chest wall: No mass, deformity, tenderness or edema.  Skin:    Capillary Refill: Capillary refill takes less than 2 seconds.  Neurological:     Mental Status: She is oriented to person, place, and time.  Psychiatric:        Mood and Affect: Mood normal. Mood is not anxious.        Behavior: Behavior normal. Behavior is not agitated.         Assessment & Plan:   Problem List Items Addressed This Visit       Other   Seasonal allergies    Takes zyrtec 10mg  daily PRN      Chest pain at rest    Intermittent CP since  over 7 months ago EKG done in the ED showed Normal sinus rhythm Lateral infarct , age undetermined Due to her family history of heart disease, PT  referred to cardiology today      Relevant Orders   Ambulatory referral to Cardiology   Dyspepsia - Primary    Intermittent chest pain could be due to dyspepsia , GERD We will do a trial of Protonix 40 mg daily. Follow-up in 2 months Avoid spicy foods, alcohol fried fatty foods      Relevant Medications   pantoprazole (PROTONIX) 40 MG tablet    Return in about 2 months (around 09/18/2021).   , FNP

## 2021-07-18 NOTE — Patient Instructions (Addendum)
  Pleas take protonix 40mg  daily.   It is important that you exercise regularly at least 30 minutes 5 times a week.  Think about what you will eat, plan ahead. Choose " clean, green, fresh or frozen" over canned, processed or packaged foods which are more sugary, salty and fatty. 70 to 75% of food eaten should be vegetables and fruit. Three meals at set times with snacks allowed between meals, but they must be fruit or vegetables. Aim to eat over a 12 hour period , example 7 am to 7 pm, and STOP after  your last meal of the day. Drink water,generally about 64 ounces per day, no other drink is as healthy. Fruit juice is best enjoyed in a healthy way, by EATING the fruit.  Thanks for choosing [facility], we consider it a privelige to serve you.

## 2021-07-18 NOTE — Assessment & Plan Note (Addendum)
Intermittent CP since  over 7 months ago EKG done in the ED showed Normal sinus rhythm Lateral infarct , age undetermined Due to her family history of heart disease, PT  referred to cardiology today

## 2021-07-18 NOTE — Assessment & Plan Note (Signed)
Takes zyrtec 10mg  daily PRN

## 2021-07-18 NOTE — Assessment & Plan Note (Signed)
Intermittent chest pain could be due to dyspepsia , GERD We will do a trial of Protonix 40 mg daily. Follow-up in 2 months Avoid spicy foods, alcohol fried fatty foods

## 2021-08-08 ENCOUNTER — Ambulatory Visit: Payer: BC Managed Care – PPO | Admitting: Cardiovascular Disease

## 2021-08-14 ENCOUNTER — Encounter: Payer: Self-pay | Admitting: Nurse Practitioner

## 2021-08-14 ENCOUNTER — Encounter: Payer: Self-pay | Admitting: Obstetrics and Gynecology

## 2021-08-14 DIAGNOSIS — Z3041 Encounter for surveillance of contraceptive pills: Secondary | ICD-10-CM

## 2021-08-14 MED ORDER — NORETHIN ACE-ETH ESTRAD-FE 1-20 MG-MCG PO TABS: 1.0000 | ORAL_TABLET | Freq: Every day | ORAL | 0 refills | Status: DC

## 2021-08-15 NOTE — Telephone Encounter (Signed)
FYI

## 2021-08-30 NOTE — Progress Notes (Signed)
21 y.o. G0P0000 Single White or Caucasian Not Hispanic or Latino female here for annual exam.  Sexually active, same partner x 15 months. Using condoms. No dyspareunia.  Period Cycle (Days): 28 Period Duration (Days): 6 Period Pattern: Regular Menstrual Flow: Moderate Menstrual Control: Tampon Menstrual Control Change Freq (Hours): 3-4 Dysmenorrhea: None  Having issues with reflux, nausea after eating. Will f/u with primary.  No bowel or bladder c/o.   Patient's last menstrual period was 08/13/2021.          Sexually active: Yes.    The current method of family planning is OCP (estrogen/progesterone).    Exercising: Yes.     Cardio and abs  Smoker:  no  Health Maintenance: Pap:  09/01/20 WNL  History of abnormal Pap:  no MMG:  left breast U/S 08/22/2018 Bi-rads 1 neg  BMD:   none  Colonoscopy: none  TDaP:  09/01/20 Gardasil: no declines    reports that she has never smoked. She has never used smokeless tobacco. She reports that she does not drink alcohol and does not use drugs. Graduated from college in 12/22. Working as a Presenter, broadcasting. Planning to go back to school to be a Management consultant of [employer].   Past Medical History:  Diagnosis Date   Medical history non-contributory     Past Surgical History:  Procedure Laterality Date   ANTERIOR CRUCIATE LIGAMENT REPAIR Right 07/18/2017   Procedure: ANTERIOR CRUCIATE LIGAMENT (ACL) REPAIR and meniscal repair lateral and medial;  Surgeon: Vickki Hearing, MD;  Location: [facility];  Service: Orthopedics;  Laterality: Right;  on 07/18/17   NO PAST SURGERIES      Current Outpatient Medications  Medication Sig Dispense Refill   cetirizine (ZYRTEC) 10 MG tablet Take 1 tablet (10 mg total) by mouth daily. 30 tablet 11   fluticasone (FLONASE) 50 MCG/ACT nasal spray Place 2 sprays into both nostrils daily. 16 g 6   norethindrone-ethinyl estradiol-FE (JUNEL FE 1/20) 1-20 MG-MCG tablet Take 1 tablet by mouth daily. 28 tablet 0    pantoprazole (PROTONIX) 40 MG tablet Take 1 tablet (40 mg total) by mouth daily. 30 tablet 3   No current facility-administered medications for this visit.    Family History  Problem Relation Age of Onset   Thyroid disease Mother    Diabetes Father    Heart disease Father    Cancer Father    Heart attack Father        at age 75   Prostate cancer Father    Thyroid disease Brother    Alzheimer's disease Paternal Uncle    Heart disease Paternal Grandfather    Breast cancer Neg Hx    Colon cancer Neg Hx     Review of Systems  All other systems reviewed and are negative.   Exam:   BP 110/74   Pulse 76   Ht 5\' 9"  (1.753 m)   Wt 151 lb (68.5 kg)   LMP 08/13/2021   SpO2 100%   BMI 22.30 kg/m   Weight change: @WEIGHTCHANGE @ Height:   Height: 5\' 9"  (175.3 cm)  Ht Readings from Last 3 Encounters:  09/06/21 5\' 9"  (1.753 m)  07/18/21 5\' 9"  (1.753 m)  02/03/21 5\' 9"  (1.753 m)    General appearance: alert, cooperative and appears stated age Head: Normocephalic, without obvious abnormality, atraumatic Neck: no adenopathy, supple, symmetrical, trachea midline and thyroid normal to inspection and palpation Lungs: clear to auscultation bilaterally Cardiovascular: regular rate and rhythm Breasts: normal appearance, no  masses or tenderness. Slight nodularity in the left breast at 9 o'clock at the periphery (prior normal u/s) Abdomen: soft, non-tender; non distended,  no masses,  no organomegaly Extremities: extremities normal, atraumatic, no cyanosis or edema Skin: Skin color, texture, turgor normal. No rashes or lesions Lymph nodes: Cervical, supraclavicular, and axillary nodes normal. No abnormal inguinal nodes palpated Neurologic: Grossly normal   Pelvic: External genitalia:  no lesions              Urethra:  normal appearing urethra with no masses, tenderness or lesions              Bartholins and Skenes: normal                 Vagina: normal appearing vagina with normal color  and discharge, no lesions              Cervix: no lesions               Bimanual Exam:  Uterus:  normal size, contour, position, consistency, mobility, non-tender              Adnexa: no mass, fullness, tenderness               Rectovaginal: Confirms               Anus:  normal sphincter tone, no lesions  Carolynn Serve, CMA chaperoned for the exam.  1. Well woman exam Discussed breast self exam Discussed calcium and vit D intake No pap this year  2. Screening examination for STD (sexually transmitted disease) - SURESWAB CT/NG/T. vaginalis - RPR - HIV Antibody (routine testing w rflx) - Hepatitis C antibody  3. Encounter for surveillance of contraceptive pills - norethindrone-ethinyl estradiol-FE (JUNEL FE 1/20) 1-20 MG-MCG tablet; Take 1 tablet by mouth daily.  Dispense: 84 tablet; Refill: 3

## 2021-09-06 ENCOUNTER — Encounter: Payer: Self-pay | Admitting: Obstetrics and Gynecology

## 2021-09-06 ENCOUNTER — Ambulatory Visit (INDEPENDENT_AMBULATORY_CARE_PROVIDER_SITE_OTHER): Payer: BC Managed Care – PPO | Admitting: Obstetrics and Gynecology

## 2021-09-06 VITALS — BP 110/74 | HR 76 | Ht 69.0 in | Wt 151.0 lb

## 2021-09-06 DIAGNOSIS — Z01419 Encounter for gynecological examination (general) (routine) without abnormal findings: Secondary | ICD-10-CM | POA: Diagnosis not present

## 2021-09-06 DIAGNOSIS — Z113 Encounter for screening for infections with a predominantly sexual mode of transmission: Secondary | ICD-10-CM | POA: Diagnosis not present

## 2021-09-06 DIAGNOSIS — Z3041 Encounter for surveillance of contraceptive pills: Secondary | ICD-10-CM

## 2021-09-06 MED ORDER — NORETHIN ACE-ETH ESTRAD-FE 1-20 MG-MCG PO TABS: 1.0000 | ORAL_TABLET | Freq: Every day | ORAL | 3 refills | Status: DC

## 2021-09-06 NOTE — Patient Instructions (Signed)

## 2021-09-07 LAB — SURESWAB CT/NG/T. VAGINALIS
C. trachomatis RNA, TMA: NOT DETECTED
N. gonorrhoeae RNA, TMA: NOT DETECTED
Trichomonas vaginalis RNA: NOT DETECTED

## 2021-09-07 LAB — HEPATITIS C ANTIBODY: Hepatitis C Ab: NONREACTIVE

## 2021-09-07 LAB — HIV ANTIBODY (ROUTINE TESTING W REFLEX): HIV 1&2 Ab, 4th Generation: NONREACTIVE

## 2021-09-07 LAB — RPR: RPR Ser Ql: NONREACTIVE

## 2021-09-22 ENCOUNTER — Ambulatory Visit: Payer: BC Managed Care – PPO | Admitting: Nurse Practitioner

## 2021-09-29 ENCOUNTER — Ambulatory Visit: Payer: BC Managed Care – PPO | Admitting: Nurse Practitioner

## 2021-11-01 DIAGNOSIS — R509 Fever, unspecified: Secondary | ICD-10-CM | POA: Diagnosis not present

## 2021-11-01 DIAGNOSIS — J029 Acute pharyngitis, unspecified: Secondary | ICD-10-CM | POA: Diagnosis not present

## 2021-12-13 DIAGNOSIS — J00 Acute nasopharyngitis [common cold]: Secondary | ICD-10-CM | POA: Diagnosis not present

## 2022-01-02 DIAGNOSIS — J019 Acute sinusitis, unspecified: Secondary | ICD-10-CM | POA: Diagnosis not present

## 2022-01-02 DIAGNOSIS — H6692 Otitis media, unspecified, left ear: Secondary | ICD-10-CM | POA: Diagnosis not present

## 2022-01-02 DIAGNOSIS — R059 Cough, unspecified: Secondary | ICD-10-CM | POA: Diagnosis not present

## 2022-05-10 DIAGNOSIS — Z1330 Encounter for screening examination for mental health and behavioral disorders, unspecified: Secondary | ICD-10-CM | POA: Diagnosis not present

## 2022-05-10 DIAGNOSIS — B360 Pityriasis versicolor: Secondary | ICD-10-CM | POA: Diagnosis not present

## 2022-05-10 DIAGNOSIS — K5900 Constipation, unspecified: Secondary | ICD-10-CM | POA: Diagnosis not present

## 2022-07-19 ENCOUNTER — Other Ambulatory Visit: Payer: Self-pay | Admitting: Oncology

## 2022-07-19 DIAGNOSIS — Z006 Encounter for examination for normal comparison and control in clinical research program: Secondary | ICD-10-CM

## 2022-08-15 DIAGNOSIS — Z0000 Encounter for general adult medical examination without abnormal findings: Secondary | ICD-10-CM | POA: Diagnosis not present

## 2022-08-15 DIAGNOSIS — Z6821 Body mass index (BMI) 21.0-21.9, adult: Secondary | ICD-10-CM | POA: Diagnosis not present

## 2022-08-15 DIAGNOSIS — Z13220 Encounter for screening for lipoid disorders: Secondary | ICD-10-CM | POA: Diagnosis not present

## 2022-08-19 DIAGNOSIS — Z3041 Encounter for surveillance of contraceptive pills: Secondary | ICD-10-CM

## 2022-08-20 MED ORDER — NORETHIN ACE-ETH ESTRAD-FE 1-20 MG-MCG PO TABS: 1.0000 | ORAL_TABLET | Freq: Every day | ORAL | 0 refills | Status: DC

## 2022-08-20 NOTE — Telephone Encounter (Signed)
Med refill request: Junel Fe 1/20 Last AEX: 09/06/21 -JJ Next AEX: 09/12/22 -JC Last MMG (if hormonal med) N/A   Rx pended for #84/0RF  Routing to covering provider.  Refill authorized: Please Advise?

## 2022-09-12 ENCOUNTER — Ambulatory Visit: Payer: BC Managed Care – PPO | Admitting: Radiology

## 2022-09-12 ENCOUNTER — Ambulatory Visit: Payer: BC Managed Care – PPO | Admitting: Obstetrics and Gynecology

## 2022-10-01 DIAGNOSIS — U071 COVID-19: Secondary | ICD-10-CM | POA: Diagnosis not present

## 2022-10-01 DIAGNOSIS — J209 Acute bronchitis, unspecified: Secondary | ICD-10-CM | POA: Diagnosis not present

## 2022-10-14 DIAGNOSIS — Z23 Encounter for immunization: Secondary | ICD-10-CM | POA: Diagnosis not present

## 2022-10-14 DIAGNOSIS — M25521 Pain in right elbow: Secondary | ICD-10-CM | POA: Diagnosis not present

## 2022-10-14 DIAGNOSIS — M542 Cervicalgia: Secondary | ICD-10-CM | POA: Diagnosis not present

## 2022-10-15 ENCOUNTER — Encounter (HOSPITAL_BASED_OUTPATIENT_CLINIC_OR_DEPARTMENT_OTHER): Payer: Self-pay | Admitting: Student

## 2022-10-15 ENCOUNTER — Ambulatory Visit (HOSPITAL_BASED_OUTPATIENT_CLINIC_OR_DEPARTMENT_OTHER)
Admission: RE | Admit: 2022-10-15 | Discharge: 2022-10-15 | Disposition: A | Discharge status: Home / Self Care | Payer: BC Managed Care – PPO | Source: Ambulatory Visit | Attending: Student | Admitting: Student

## 2022-10-15 ENCOUNTER — Other Ambulatory Visit (HOSPITAL_BASED_OUTPATIENT_CLINIC_OR_DEPARTMENT_OTHER): Payer: Self-pay

## 2022-10-15 ENCOUNTER — Ambulatory Visit (HOSPITAL_BASED_OUTPATIENT_CLINIC_OR_DEPARTMENT_OTHER): Payer: BC Managed Care – PPO

## 2022-10-15 ENCOUNTER — Ambulatory Visit (HOSPITAL_BASED_OUTPATIENT_CLINIC_OR_DEPARTMENT_OTHER): Payer: BC Managed Care – PPO | Admitting: Student

## 2022-10-15 DIAGNOSIS — M25521 Pain in right elbow: Secondary | ICD-10-CM

## 2022-10-15 DIAGNOSIS — M542 Cervicalgia: Secondary | ICD-10-CM | POA: Diagnosis not present

## 2022-10-15 DIAGNOSIS — S199XXA Unspecified injury of neck, initial encounter: Secondary | ICD-10-CM | POA: Diagnosis not present

## 2022-10-15 DIAGNOSIS — S59901A Unspecified injury of right elbow, initial encounter: Secondary | ICD-10-CM | POA: Diagnosis not present

## 2022-10-15 MED ORDER — CEFDINIR 300 MG PO CAPS
300.0000 mg | ORAL_CAPSULE | Freq: Two times a day (BID) | ORAL | 0 refills | Status: AC
  Filled 2022-10-15: qty 20, 10d supply, fill #0

## 2022-10-15 MED ORDER — METHOCARBAMOL 500 MG PO TABS
500.0000 mg | ORAL_TABLET | Freq: Four times a day (QID) | ORAL | 0 refills | Status: AC | PRN
  Filled 2022-10-15: qty 28, 7d supply, fill #0
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
## 2022-10-18 ENCOUNTER — Other Ambulatory Visit (HOSPITAL_BASED_OUTPATIENT_CLINIC_OR_DEPARTMENT_OTHER): Payer: Self-pay

## 2022-10-18 MED ORDER — FLULAVAL 0.5 ML IM SUSY
PREFILLED_SYRINGE | Freq: Once | INTRAMUSCULAR | 0 refills | Status: AC
  Filled 2022-10-18: qty 0.5, 1d supply, fill #0

## 2022-10-19 ENCOUNTER — Encounter (HOSPITAL_BASED_OUTPATIENT_CLINIC_OR_DEPARTMENT_OTHER): Payer: Self-pay

## 2022-10-23 NOTE — Telephone Encounter (Signed)
Scheduled pt

## 2022-10-26 ENCOUNTER — Encounter (HOSPITAL_BASED_OUTPATIENT_CLINIC_OR_DEPARTMENT_OTHER): Payer: Self-pay | Admitting: Student

## 2022-10-26 ENCOUNTER — Ambulatory Visit (HOSPITAL_BASED_OUTPATIENT_CLINIC_OR_DEPARTMENT_OTHER): Payer: BC Managed Care – PPO | Admitting: Student

## 2022-10-26 ENCOUNTER — Ambulatory Visit (HOSPITAL_BASED_OUTPATIENT_CLINIC_OR_DEPARTMENT_OTHER): Payer: BC Managed Care – PPO

## 2022-10-26 DIAGNOSIS — M25511 Pain in right shoulder: Secondary | ICD-10-CM | POA: Diagnosis not present

## 2022-10-26 DIAGNOSIS — M25521 Pain in right elbow: Secondary | ICD-10-CM
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
## 2022-11-07 ENCOUNTER — Other Ambulatory Visit (HOSPITAL_BASED_OUTPATIENT_CLINIC_OR_DEPARTMENT_OTHER): Payer: Self-pay

## 2022-11-07 DIAGNOSIS — Z3041 Encounter for surveillance of contraceptive pills: Secondary | ICD-10-CM

## 2022-11-07 MED ORDER — NORETHIN ACE-ETH ESTRAD-FE 1-20 MG-MCG PO TABS
1.0000 | ORAL_TABLET | Freq: Every day | ORAL | 0 refills | Status: DC
  Filled 2022-11-07: qty 28, 28d supply, fill #0

## 2022-11-07 NOTE — Telephone Encounter (Signed)
Med refill request: Junel Fe 1-20 Last AEX: 09/06/21 -JJ Next AEX: 11/14/22 -JC Last MMG (if hormonal med) N/A Refill authorized: Please Advise?

## 2022-11-14 ENCOUNTER — Other Ambulatory Visit (HOSPITAL_BASED_OUTPATIENT_CLINIC_OR_DEPARTMENT_OTHER): Payer: Self-pay

## 2022-11-14 ENCOUNTER — Ambulatory Visit (INDEPENDENT_AMBULATORY_CARE_PROVIDER_SITE_OTHER): Payer: BC Managed Care – PPO | Admitting: Radiology

## 2022-11-14 ENCOUNTER — Encounter: Payer: Self-pay | Admitting: Radiology

## 2022-11-14 VITALS — BP 110/66 | Ht 67.5 in | Wt 139.0 lb

## 2022-11-14 DIAGNOSIS — Z01419 Encounter for gynecological examination (general) (routine) without abnormal findings: Secondary | ICD-10-CM | POA: Diagnosis not present

## 2022-11-14 DIAGNOSIS — Z3041 Encounter for surveillance of contraceptive pills: Secondary | ICD-10-CM

## 2022-11-14 DIAGNOSIS — N926 Irregular menstruation, unspecified: Secondary | ICD-10-CM | POA: Diagnosis not present

## 2022-11-14 LAB — PREGNANCY, URINE: Preg Test, Ur: NEGATIVE

## 2022-11-14 MED ORDER — NORETHIN ACE-ETH ESTRAD-FE 1-20 MG-MCG PO TABS
1.0000 | ORAL_TABLET | Freq: Every day | ORAL | 4 refills | Status: DC
  Filled 2022-11-14 – 2022-12-03 (×4): qty 84, 84d supply, fill #0
  Filled 2023-02-26: qty 84, 84d supply, fill #1
  Filled 2023-05-21: qty 84, 84d supply, fill #2
  Filled 2023-08-14: qty 84, 84d supply, fill #3
  Filled 2023-11-07: qty 84, 84d supply, fill #4

## 2022-11-14 NOTE — Progress Notes (Signed)
   Kayla May 06-Jun-2000 [id number]   History:  22 y.o. G0 presents for annual exam.Complains of late for period on OCPs. LMP: 10/09/22. Treated for Covid19 recently, right elbow fracture 09/29/22. P 09/01/20 normal   Gynecologic History Patient's last menstrual period was 10/09/2022 (approximate). Period Cycle (Days):  (skipped last period on OCPs) Period Pattern: (!) Irregular Menstrual Flow: Light Menstrual Control: Tampon Dysmenorrhea: (!) Mild Dysmenorrhea Symptoms: Cramping Contraception/Family planning: OCP (estrogen/progesterone) Sexually active: yes, no new partners   Obstetric History OB History  Gravida Para Term Preterm AB Living  0 0 0 0 0 0  SAB IAB Ectopic Multiple Live Births  0 0 0 0 0    The following portions of the patient's history were reviewed and updated as appropriate: allergies, current medications, past family history, past medical history, past social history, past surgical history, and problem list.  Review of Systems  All other systems reviewed and are negative.   Past medical history, past surgical history, family history and social history were all reviewed and documented in the EPIC chart.  Exam:  Vitals:   11/14/22 1456  BP: 110/66  Weight: 139 lb (63 kg)  Height: 5' 7.5" (1.715 m)   Body mass index is 21.45 kg/m.  Physical Exam   Raynelle Fanning, CMA present for exam  Assessment/Plan:   1. Well woman exam with routine gynecological exam Pap 2025  2. Menstrual period late Negative UPT, reassured - Pregnancy, urine  3. Encounter for surveillance of contraceptive pills - norethindrone-ethinyl estradiol-FE (JUNEL FE 1/20) 1-20 MG-MCG tablet; Take 1 tablet by mouth daily.  Dispense: 84 tablet; Refill: 4    Discussed SBE, pap and STI screening as directed/appropriate. Recommend of exercise weekly, including weight bearing exercise. Encouraged the use of seatbelts and sunscreen.  Return in about 1 year (around 11/14/2023)  for Annual.  Tanda Rockers WHNP-BC 3:42 PM 11/14/2022

## 2022-11-30 ENCOUNTER — Other Ambulatory Visit: Payer: Self-pay

## 2022-11-30 ENCOUNTER — Other Ambulatory Visit (HOSPITAL_BASED_OUTPATIENT_CLINIC_OR_DEPARTMENT_OTHER): Payer: Self-pay

## 2022-12-03 ENCOUNTER — Other Ambulatory Visit: Payer: Self-pay

## 2022-12-03 ENCOUNTER — Other Ambulatory Visit (HOSPITAL_BASED_OUTPATIENT_CLINIC_OR_DEPARTMENT_OTHER): Payer: Self-pay

## 2023-02-27 ENCOUNTER — Other Ambulatory Visit (HOSPITAL_BASED_OUTPATIENT_CLINIC_OR_DEPARTMENT_OTHER): Payer: Self-pay

## 2023-02-27 ENCOUNTER — Other Ambulatory Visit: Payer: Self-pay

## 2023-05-21 ENCOUNTER — Other Ambulatory Visit: Payer: Self-pay

## 2023-06-16 IMAGING — DX DG CHEST 2V
2 series · 2 of 2 positions shown · non-contrast
Comparison: None.

CLINICAL DATA: Chest pain.

EXAM:
CHEST - 2 VIEW

[chest pa]
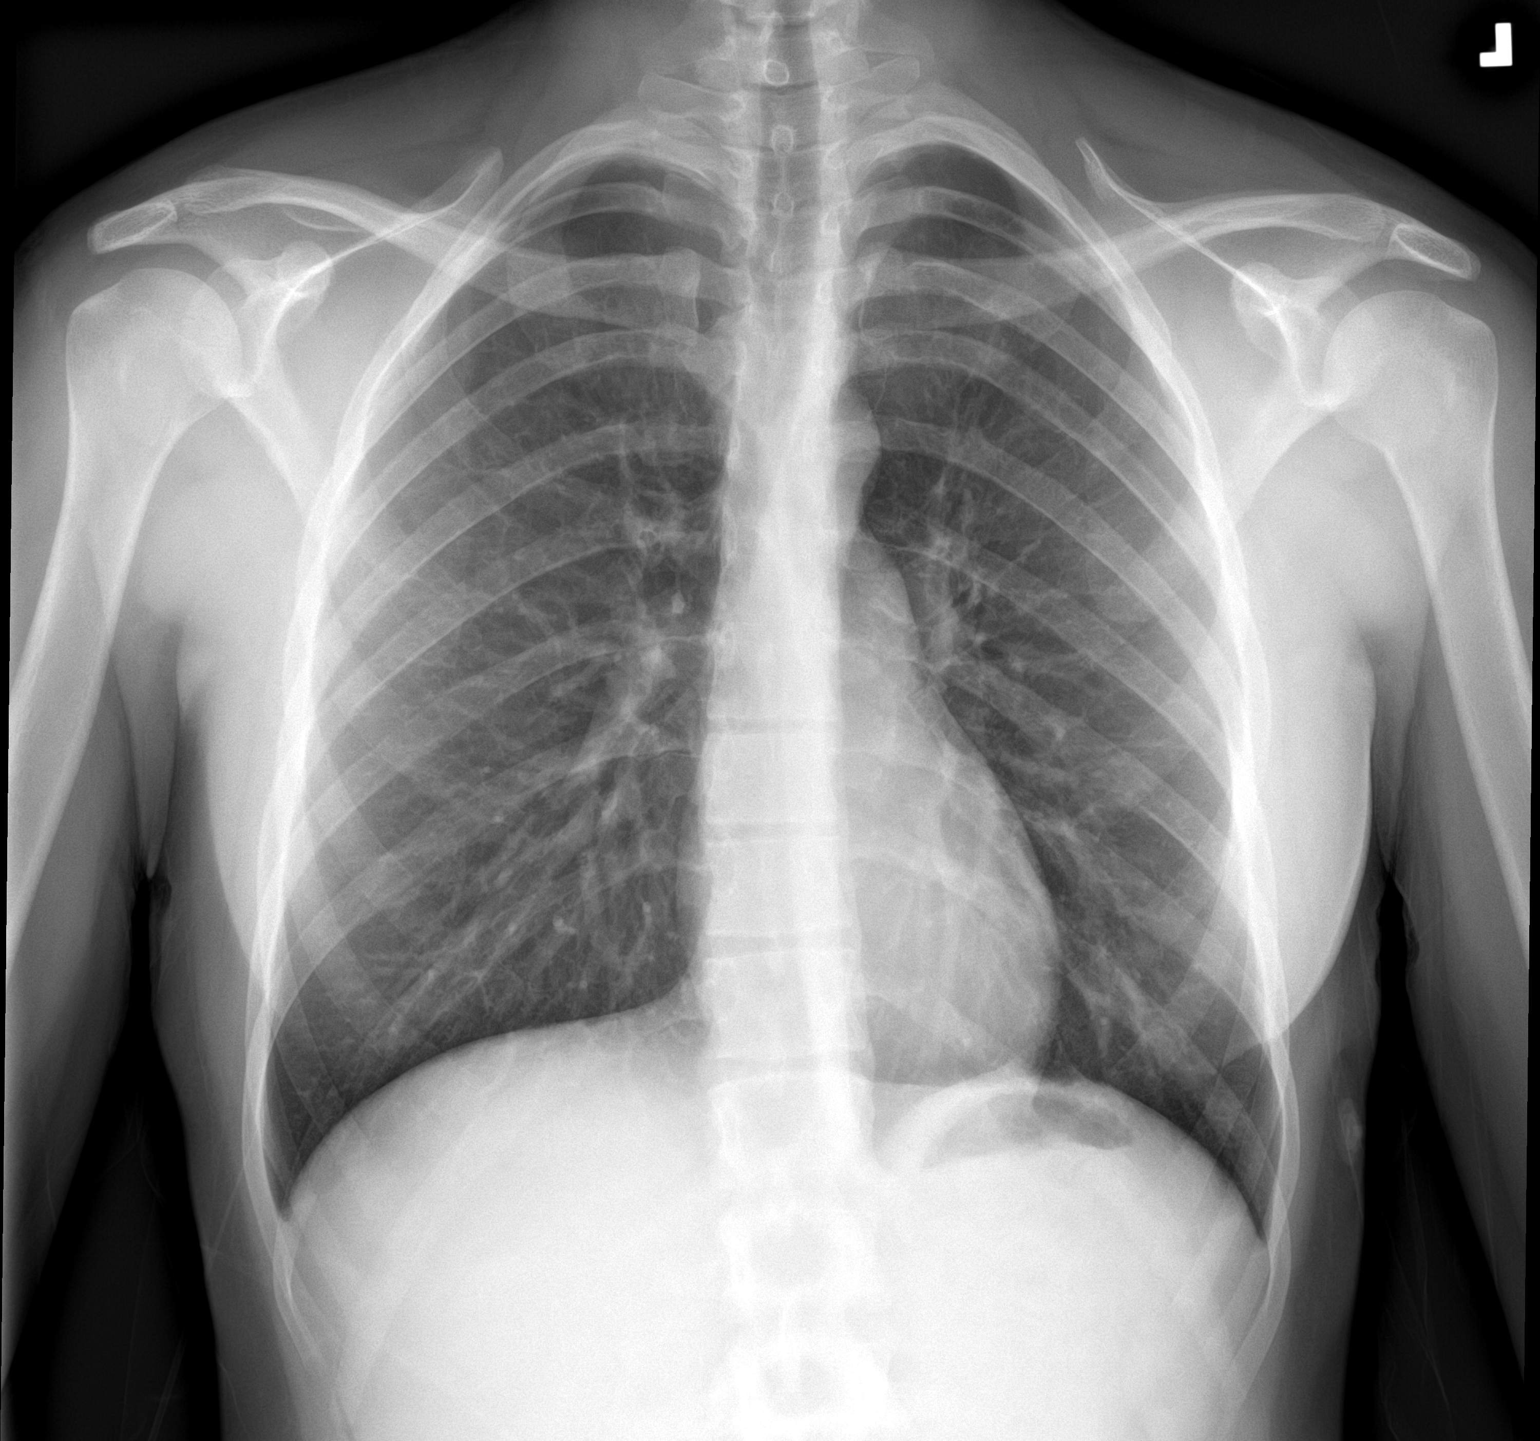

[chest lat]
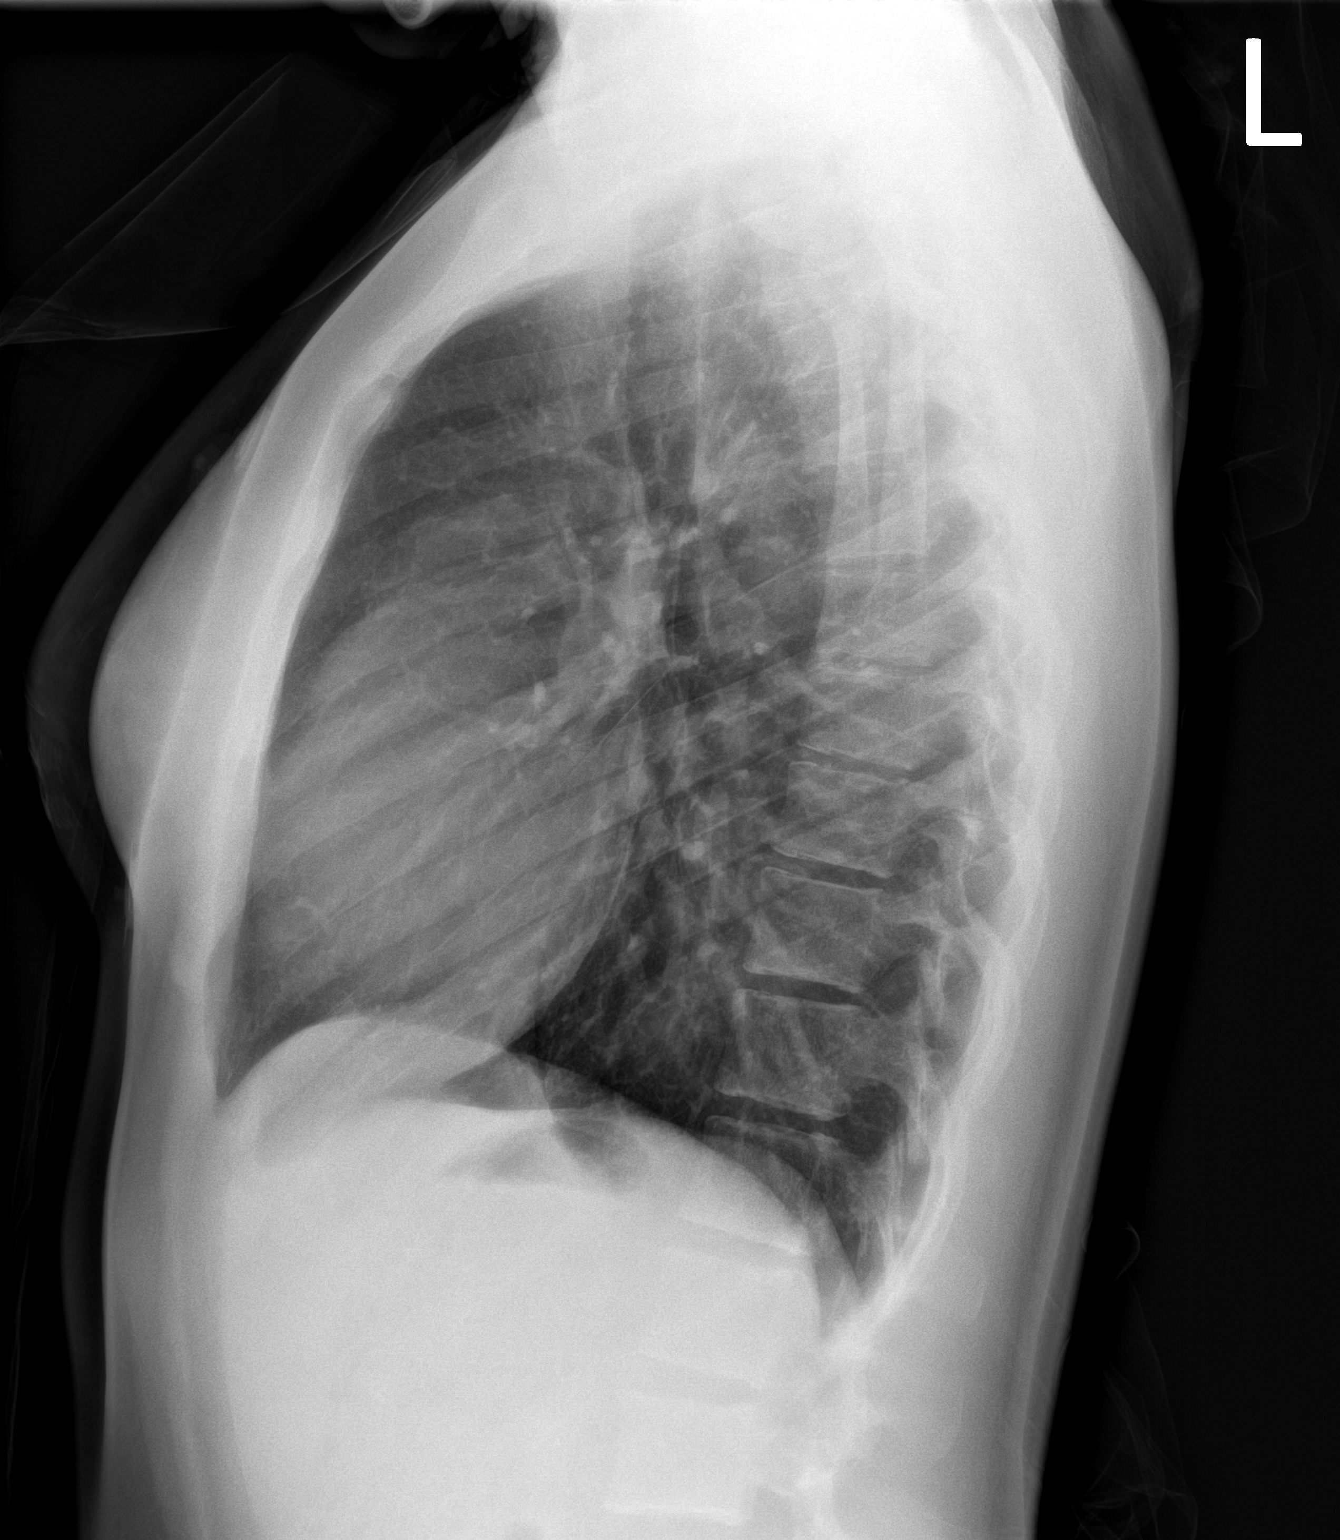

[2 of 2 positions shown; findings below may reference images not displayed]

FINDINGS: The heart size and mediastinal contours are within normal limits.
Both lungs are clear. The visualized skeletal structures are
unremarkable.
IMPRESSION: No active cardiopulmonary disease.

## 2023-09-05 ENCOUNTER — Encounter: Payer: Self-pay | Admitting: Dermatology

## 2023-09-05 ENCOUNTER — Ambulatory Visit: Admitting: Dermatology

## 2023-09-05 DIAGNOSIS — L578 Other skin changes due to chronic exposure to nonionizing radiation: Secondary | ICD-10-CM

## 2023-09-05 DIAGNOSIS — L814 Other melanin hyperpigmentation: Secondary | ICD-10-CM

## 2023-09-05 DIAGNOSIS — D1801 Hemangioma of skin and subcutaneous tissue: Secondary | ICD-10-CM

## 2023-09-05 DIAGNOSIS — D229 Melanocytic nevi, unspecified: Secondary | ICD-10-CM

## 2023-09-05 DIAGNOSIS — L811 Chloasma: Secondary | ICD-10-CM | POA: Diagnosis not present

## 2023-09-05 DIAGNOSIS — Z1283 Encounter for screening for malignant neoplasm of skin: Secondary | ICD-10-CM

## 2023-09-05 DIAGNOSIS — W908XXA Exposure to other nonionizing radiation, initial encounter: Secondary | ICD-10-CM

## 2023-09-05 NOTE — Patient Instructions (Addendum)

## 2023-09-05 NOTE — Progress Notes (Signed)
   New Patient Visit   Subjective  Kayla May is a 23 y.o. female who presents for the following: Skin Cancer Screening and Full Body Skin Exam  The patient presents for Total-Body Skin Exam (TBSE) for skin cancer screening and mole check. The patient has spots, moles and lesions to be evaluated, some may be new or changing.  Sun spot on the forehead. There is a mole in the scalp that has been present since birth, there are no changes that she has been able to notice.  Patient states that she does not spend a lot of time outside, but wears SPF 30 or greater when outside. Her moisturizer that she applies to her face daily has SPF. Patient wears shorts in the summer.  The following portions of the chart were reviewed this encounter and updated as appropriate: medications, allergies, medical history  Review of Systems:  No other skin or systemic complaints except as noted in HPI or Assessment and Plan.  Objective  Well appearing patient in no apparent distress; mood and affect are within normal limits.  A full examination was performed including scalp, head, eyes, ears, nose, lips, neck, chest, axillae, abdomen, back, buttocks, bilateral upper extremities, bilateral lower extremities, hands, feet, fingers, toes, fingernails, and toenails. All findings within normal limits unless otherwise noted below.   Relevant physical exam findings are noted in the Assessment and Plan. Mid Forehead reticulated hyperpigmented patches at face  Assessment & Plan   SKIN CANCER SCREENING PERFORMED TODAY.  ACTINIC DAMAGE - Chronic condition, secondary to cumulative UV/sun exposure - diffuse scaly erythematous macules with underlying dyspigmentation - Recommend daily broad spectrum sunscreen SPF 30+ to sun-exposed areas, reapply every 2 hours as needed.  - Staying in the shade or wearing long sleeves, sun glasses (UVA+UVB protection) and wide brim hats (4-inch brim around the entire circumference of  the hat) are also recommended for sun protection.  - Call for new or changing lesions.  LENTIGINES, HEMANGIOMAS - Benign normal skin lesions - Benign-appearing - Call for any changes  MELANOCYTIC NEVI - Tan-brown and/or pink-flesh-colored symmetric macules and papules - Benign appearing on exam today - Observation - Call clinic for new or changing moles - Recommend daily use of broad spectrum spf 30+ sunscreen to sun-exposed areas.   MELASMA Exam: reticulated hyperpigmented patches at forehead  flared  Melasma is a chronic; persistent condition of hyperpigmented patches generally on the face, worse in summer due to higher UV exposure.    Heredity; thyroid  disease; sun exposure; pregnancy; birth control pills; epilepsy medication and darker skin may predispose to Melasma.   Recommendations include: - Sun avoidance and daily broad spectrum (UVA/UVB) tinted mineral sunscreen SPF 30+, with Zinc or Titanium Dioxide. - Eucerin Radiant Tone recommended  Treatment Plan: - recommend OTC Eucerin Radiant Tone    Return in about 2 years (around 09/04/2025) for FBSE.  LILLETTE Rollene Gobble, RN, am acting as scribe for RUFUS CHRISTELLA HOLY, MD .   Documentation: I have reviewed the above documentation for accuracy and completeness, and I agree with the above.  RUFUS CHRISTELLA HOLY, MD

## 2023-09-27 DIAGNOSIS — J209 Acute bronchitis, unspecified: Secondary | ICD-10-CM | POA: Diagnosis not present

## 2023-09-27 DIAGNOSIS — J069 Acute upper respiratory infection, unspecified: Secondary | ICD-10-CM | POA: Diagnosis not present

## 2023-09-27 DIAGNOSIS — J020 Streptococcal pharyngitis: Secondary | ICD-10-CM | POA: Diagnosis not present

## 2023-09-27 DIAGNOSIS — J029 Acute pharyngitis, unspecified: Secondary | ICD-10-CM | POA: Diagnosis not present

## 2023-09-27 DIAGNOSIS — J0110 Acute frontal sinusitis, unspecified: Secondary | ICD-10-CM | POA: Diagnosis not present

## 2023-09-30 ENCOUNTER — Ambulatory Visit (HOSPITAL_BASED_OUTPATIENT_CLINIC_OR_DEPARTMENT_OTHER): Admitting: Family Medicine

## 2023-10-12 DIAGNOSIS — H9201 Otalgia, right ear: Secondary | ICD-10-CM | POA: Diagnosis not present

## 2023-10-12 DIAGNOSIS — H6091 Unspecified otitis externa, right ear: Secondary | ICD-10-CM | POA: Diagnosis not present

## 2023-10-18 ENCOUNTER — Other Ambulatory Visit: Payer: Self-pay | Admitting: Medical Genetics

## 2023-10-18 DIAGNOSIS — Z006 Encounter for examination for normal comparison and control in clinical research program: Secondary | ICD-10-CM

## 2023-10-22 ENCOUNTER — Other Ambulatory Visit (HOSPITAL_BASED_OUTPATIENT_CLINIC_OR_DEPARTMENT_OTHER): Payer: Self-pay

## 2023-10-22 MED ORDER — FLUZONE 0.5 ML IM SUSY
0.5000 mL | PREFILLED_SYRINGE | Freq: Once | INTRAMUSCULAR | 0 refills | Status: AC
  Filled 2023-10-22: qty 0.5, 1d supply, fill #0

## 2023-11-03 DIAGNOSIS — Z03818 Encounter for observation for suspected exposure to other biological agents ruled out: Secondary | ICD-10-CM | POA: Diagnosis not present

## 2023-11-03 DIAGNOSIS — J00 Acute nasopharyngitis [common cold]: Secondary | ICD-10-CM | POA: Diagnosis not present

## 2023-11-05 LAB — GENECONNECT MOLECULAR SCREEN: Genetic Analysis Overall Interpretation: NEGATIVE

## 2023-11-15 ENCOUNTER — Ambulatory Visit: Payer: BC Managed Care – PPO | Admitting: Radiology

## 2023-11-19 ENCOUNTER — Encounter: Payer: Self-pay | Admitting: Radiology

## 2023-11-19 ENCOUNTER — Other Ambulatory Visit (HOSPITAL_COMMUNITY)
Admission: RE | Admit: 2023-11-19 | Discharge: 2023-11-19 | Disposition: A | Discharge status: Home / Self Care | Source: Ambulatory Visit | Attending: Radiology | Admitting: Radiology

## 2023-11-19 ENCOUNTER — Other Ambulatory Visit (HOSPITAL_BASED_OUTPATIENT_CLINIC_OR_DEPARTMENT_OTHER): Payer: Self-pay

## 2023-11-19 ENCOUNTER — Ambulatory Visit: Admitting: Radiology

## 2023-11-19 VITALS — BP 124/78 | HR 69 | Ht 67.5 in | Wt 142.0 lb

## 2023-11-19 DIAGNOSIS — Z3041 Encounter for surveillance of contraceptive pills: Secondary | ICD-10-CM

## 2023-11-19 DIAGNOSIS — R7303 Prediabetes: Secondary | ICD-10-CM | POA: Diagnosis not present

## 2023-11-19 DIAGNOSIS — L659 Nonscarring hair loss, unspecified: Secondary | ICD-10-CM | POA: Diagnosis not present

## 2023-11-19 DIAGNOSIS — Z01419 Encounter for gynecological examination (general) (routine) without abnormal findings: Secondary | ICD-10-CM | POA: Diagnosis not present

## 2023-11-19 DIAGNOSIS — E785 Hyperlipidemia, unspecified: Secondary | ICD-10-CM | POA: Diagnosis not present

## 2023-11-19 DIAGNOSIS — Z1331 Encounter for screening for depression: Secondary | ICD-10-CM

## 2023-11-19 MED ORDER — JUNEL FE 1/20 1-20 MG-MCG PO TABS
1.0000 | ORAL_TABLET | Freq: Every day | ORAL | 4 refills | Status: AC
  Filled 2023-11-19 – 2024-01-29 (×2): qty 84, 84d supply, fill #0

## 2023-11-19 NOTE — Patient Instructions (Signed)

## 2023-11-19 NOTE — Progress Notes (Signed)
 Kayla May May 08, 2000 [id number]   History:  23 y.o. G0 presents for annual exam. Concerned about scalp hair loss. Losing large amounts when brushing and washing. Hair is much thinner. Would like labs today, mother has a hx of hypothryoidism. Doing well on OCPs, needs a refill. No new partners. No other gyn concerns. Looking for a new PCP.  Gynecologic History Patient's last menstrual period was 11/04/2023 (exact date). Period Cycle (Days): 28 Period Duration (Days): 3-4 Period Pattern: Regular Menstrual Flow: Light Menstrual Control: Tampon Dysmenorrhea: None Contraception/Family planning: OCP (estrogen/progesterone) Sexually active: yes Last Pap: 2022. Results were: normal   Obstetric History OB History  Gravida Para Term Preterm AB Living  0 0 0 0 0 0  SAB IAB Ectopic Multiple Live Births  0 0 0 0 0       11/19/2023    2:29 PM 07/18/2021    3:53 PM 08/07/2017   11:36 AM  Depression screen PHQ 2/9  Decreased Interest 0 0 0  Down, Depressed, Hopeless 0 0 0  PHQ - 2 Score 0 0 0     The following portions of the patient's history were reviewed and updated as appropriate: allergies, current medications, past family history, past medical history, past social history, past surgical history, and problem list.  Review of Systems  All other systems reviewed and are negative.   Past medical history, past surgical history, family history and social history were all reviewed and documented in the EPIC chart.  Exam:  Vitals:   11/19/23 1428  BP: 124/78  Pulse: 69  SpO2: 99%  Weight: 142 lb (64.4 kg)  Height: 5' 7.5 (1.715 m)   Body mass index is 21.91 kg/m.  Physical Exam Vitals and nursing note reviewed. Exam conducted with a chaperone present.  Constitutional:      Appearance: Normal appearance. She is normal weight.  HENT:     Head: Normocephalic and atraumatic.  Neck:     Thyroid : No thyroid  mass, thyromegaly or thyroid  tenderness.  Cardiovascular:      Rate and Rhythm: Regular rhythm.     Heart sounds: Normal heart sounds.  Pulmonary:     Effort: Pulmonary effort is normal.     Breath sounds: Normal breath sounds.  Chest:  Breasts:    Breasts are symmetrical.     Right: Normal. No inverted nipple, mass, nipple discharge, skin change or tenderness.     Left: Normal. No inverted nipple, mass, nipple discharge, skin change or tenderness.  Abdominal:     General: Abdomen is flat. Bowel sounds are normal.     Palpations: Abdomen is soft.  Genitourinary:    General: Normal vulva.     Vagina: Normal. No vaginal discharge, bleeding or lesions.     Cervix: Normal. No discharge or lesion.     Uterus: Normal. Not enlarged and not tender.      Adnexa: Right adnexa normal and left adnexa normal.       Right: No mass, tenderness or fullness.         Left: No mass, tenderness or fullness.    Lymphadenopathy:     Upper Body:     Right upper body: No axillary adenopathy.     Left upper body: No axillary adenopathy.  Skin:    General: Skin is warm and dry.  Neurological:     Mental Status: She is alert and oriented to person, place, and time.  Psychiatric:        Mood and  Affect: Mood normal.        Thought Content: Thought content normal.        Judgment: Judgment normal.      Kayla May, CMA present for exam  Assessment/Plan:   1. Well woman exam with routine gynecological exam (Primary) - Cytology - PAP( [facility])  2. Encounter for surveillance of contraceptive pills - norethindrone -ethinyl estradiol -FE (JUNEL  FE 1/20) 1-20 MG-MCG tablet; Take 1 tablet by mouth daily.  Dispense: 84 tablet; Refill: 4  3. Diffuse loss of scalp hair - Thyroid  Panel With TSH - Vitamin D (25 hydroxy) - CBC - Iron, TIBC and Ferritin Panel  4. Depression screening     Return in about 1 year (around 11/18/2024) for Annual.  GINETTE COZIER B WHNP-BC 2:52 PM 11/19/2023

## 2023-11-20 ENCOUNTER — Ambulatory Visit: Payer: Self-pay | Admitting: Radiology

## 2023-11-20 LAB — THYROID PANEL WITH TSH
Free Thyroxine Index: 2.1 (ref 1.4–3.8)
T3 Uptake: 29 % (ref 22–35)
T4, Total: 7.4 ug/dL (ref 5.1–11.9)
TSH: 1.89 m[IU]/L

## 2023-11-20 LAB — CBC
HCT: 38.8 % (ref 35.0–45.0)
Hemoglobin: 13.2 g/dL (ref 11.7–15.5)
MCH: 32.1 pg (ref 27.0–33.0)
MCHC: 34 g/dL (ref 32.0–36.0)
MCV: 94.4 fL (ref 80.0–100.0)
MPV: 8.8 fL (ref 7.5–12.5)
Platelets: 323 Thousand/uL (ref 140–400)
RBC: 4.11 Million/uL (ref 3.80–5.10)
RDW: 12.4 % (ref 11.0–15.0)
WBC: 7.5 Thousand/uL (ref 3.8–10.8)

## 2023-11-20 LAB — VITAMIN D 25 HYDROXY (VIT D DEFICIENCY, FRACTURES): Vit D, 25-Hydroxy: 36 ng/mL (ref 30–100)

## 2023-11-20 LAB — IRON,TIBC AND FERRITIN PANEL
%SAT: 29 % (ref 16–45)
Ferritin: 103 ng/mL (ref 16–154)
Iron: 103 ug/dL (ref 40–190)
TIBC: 359 ug/dL (ref 250–450)

## 2023-11-20 LAB — CYTOLOGY - PAP
Adequacy: ABSENT
Diagnosis: NEGATIVE

## 2023-12-31 ENCOUNTER — Ambulatory Visit (HOSPITAL_BASED_OUTPATIENT_CLINIC_OR_DEPARTMENT_OTHER)

## 2023-12-31 ENCOUNTER — Encounter (HOSPITAL_BASED_OUTPATIENT_CLINIC_OR_DEPARTMENT_OTHER): Payer: Self-pay

## 2023-12-31 VITALS — BP 117/74 | HR 69 | Ht 67.5 in | Wt 145.2 lb

## 2023-12-31 DIAGNOSIS — L659 Nonscarring hair loss, unspecified: Secondary | ICD-10-CM | POA: Diagnosis not present

## 2023-12-31 DIAGNOSIS — Z7689 Persons encountering health services in other specified circumstances: Secondary | ICD-10-CM | POA: Diagnosis not present

## 2023-12-31 NOTE — Progress Notes (Signed)
 "   New Patient Office Visit  Subjective:   Kayla May 2000-01-18 12/31/2023  Chief Complaint  Patient presents with   Transitions Of Care    Patient is here to get established with new PCP. States she has a lingering cough and feels congestion in her chest.    HPI: Kayla May presents today to establish care at [facility] Care [facility] Medicine at [facility]. Introduced to publishing rights manager role and practice setting with verbalized understanding by patient.  All questions answered.   Last PCP: 2025 Last annual physical: 2025 Concerns: See below   Chest Congestion: Patient reports this morning she woke up with a dry cough. Denies fever, congestion, post nasal drip, or sick contacts. States she did try a dose of cough medication this morning but her cough was still prevalent throughout the day.  Hair Loss: Patient reports she was recently seen by OBGYN and discussed recent hair loss. States she has not started any new medication and denies any recent stressors. Her OB did blood work and patient did not know if there was anything else we could do.    The following portions of the patient's history were reviewed and updated as appropriate: past medical history, past surgical history, family history, social history, allergies, medications, and problem list.   Patient Active Problem List   Diagnosis Date Noted   Seasonal allergies 07/18/2021   Chest pain at rest 07/18/2021   Dyspepsia 07/18/2021   Family history of diabetes mellitus (DM) 08/19/2018   S/P ACL repair right knee 07/18/17    Tear of lateral meniscus of right knee, current    Past Medical History:  Diagnosis Date   Medical history non-contributory    Past Surgical History:  Procedure Laterality Date   ANTERIOR CRUCIATE LIGAMENT REPAIR Right 07/18/2017   Procedure: ANTERIOR CRUCIATE LIGAMENT (ACL) REPAIR and meniscal repair lateral and medial;  Surgeon: Margrette Taft BRAVO, MD;  Location:  [facility];  Service: Orthopedics;  Laterality: Right;  on 07/18/17   NO PAST SURGERIES     Family History  Problem Relation Age of Onset   Thyroid  disease Mother    Diabetes Father    Heart disease Father    Cancer Father    Heart attack Father        at age 34   Prostate cancer Father    Skin cancer Father    Thyroid  disease Brother    Heart disease Paternal Grandfather    Alzheimer's disease Paternal Uncle    Breast cancer Neg Hx    Colon cancer Neg Hx    Social History   Socioeconomic History   Marital status: Single    Spouse name: Not on file   Number of children: Not on file   Years of education: Not on file   Highest education level: Not on file  Occupational History   Not on file  Tobacco Use   Smoking status: Never    Passive exposure: Past   Smokeless tobacco: Never  Vaping Use   Vaping status: Never Used  Substance and Sexual Activity   Alcohol use: No   Drug use: No   Sexual activity: Yes    Partners: Male    Birth control/protection: Pill    Comment: menarche 23yo, sexual debut 23yo  Other Topics Concern   Not on file  Social History Narrative   Lives with her parents    Social Drivers of Health   Tobacco Use: Low Risk (12/31/2023)  Patient History    Smoking Tobacco Use: Never    Smokeless Tobacco Use: Never    Passive Exposure: Past  Financial Resource Strain: Low Risk (12/31/2023)   Overall Financial Resource Strain (CARDIA)    Difficulty of Paying Living Expenses: Not hard at all  Food Insecurity: No Food Insecurity (12/31/2023)   Epic    Worried About Programme Researcher, Broadcasting/film/video in the Last Year: Never true    Ran Out of Food in the Last Year: Never true  Transportation Needs: No Transportation Needs (12/31/2023)   Epic    Lack of Transportation (Medical): No    Lack of Transportation (Non-Medical): No  Physical Activity: Sufficiently Active (12/31/2023)   Exercise Vital Sign    Days of Exercise per Week: 5 days    Minutes of Exercise per  Session: 50 min  Stress: No Stress Concern Present (12/31/2023)   [employer] of Occupational Health - Occupational Stress Questionnaire    Feeling of Stress: Not at all  Social Connections: Moderately Integrated (12/31/2023)   Social Connection and Isolation Panel    Frequency of Communication with Friends and Family: More than three times a week    Frequency of Social Gatherings with Friends and Family: More than three times a week    Attends Religious Services: More than 4 times per year    Active Member of Clubs or Organizations: Yes    Attends Banker Meetings: More than 4 times per year    Marital Status: Never married  Intimate Partner Violence: Not At Risk (12/31/2023)   Epic    Fear of Current or Ex-Partner: No    Emotionally Abused: No    Physically Abused: No    Sexually Abused: No  Depression (PHQ2-9): Low Risk (11/19/2023)   Depression (PHQ2-9)    PHQ-2 Score: 0  Alcohol Screen: Low Risk (12/31/2023)   Alcohol Screen    Last Alcohol Screening Score (AUDIT): 0  Housing: Low Risk (12/31/2023)   Epic    Unable to Pay for Housing in the Last Year: No    Number of Times Moved in the Last Year: 0    Homeless in the Last Year: No  Utilities: Not At Risk (12/31/2023)   Epic    Threatened with loss of utilities: No  Health Literacy: Adequate Health Literacy (12/31/2023)   B1300 Health Literacy    Frequency of need for help with medical instructions: Never   Outpatient Medications Prior to Visit  Medication Sig Dispense Refill   cetirizine  (ZYRTEC ) 10 MG tablet Take 1 tablet (10 mg total) by mouth daily. 30 tablet 11   Multiple Vitamin (MULTIVITAMIN PO) Take by mouth.     NONFORMULARY OR COMPOUNDED ITEM Collagen powder     norethindrone -ethinyl estradiol -FE (JUNEL  FE 1/20) 1-20 MG-MCG tablet Take 1 tablet by mouth daily. 84 tablet 4   pantoprazole  (PROTONIX ) 40 MG tablet Take 1 tablet (40 mg total) by mouth daily. 30 tablet 3   No  facility-administered medications prior to visit.   Allergies[1]  ROS: A complete ROS was performed with pertinent positives/negatives noted in the HPI. The remainder of the ROS are negative.   Objective:   Today's Vitals   12/31/23 1428  BP: 117/74  Pulse: 69  SpO2: 96%  Weight: 145 lb 3.2 oz (65.9 kg)  Height: 5' 7.5 (1.715 m)    GENERAL: Well-appearing, in NAD. Well nourished. RESPIRATORY: Chest wall symmetrical. Respirations even and non-labored. Breath sounds clear to auscultation bilaterally.  CARDIAC: S1,  S2 present, regular rate and rhythm without murmur or gallops. Peripheral pulses 2+ bilaterally.  PSYCH/MENTAL STATUS: Alert, oriented x 3. Cooperative, appropriate mood and affect.    Health Maintenance Due  Topic Date Due   HPV VACCINES (1 - 3-dose series) Never done   CHLAMYDIA SCREENING  09/07/2022   COVID-19 Vaccine (1 - 2025-26 season) Never done    No results found for any visits on 12/31/23.     Assessment & Plan:  1. Encounter to establish care with new doctor (Primary) Discussed role of NP and expectations of the Primary Care Clinic. Discussed medical, surgical, and family history.   2. Hair loss Discussed obtaining a B12 level as that was not obtained previously. Discussed starting a daily multivitamin and increasing her protein intake to help increase her daily nutrient intake.  - B12 and Folate Panel   Patient to reach out to office if new, worrisome, or unresolved symptoms arise or if no improvement in patient's condition. Patient verbalized understanding and is agreeable to treatment plan. All questions answered to patient's satisfaction.    Return in about 3 months (around 03/30/2024) for annual physical (fasting labs at appt).    Lauraine Almarie Angus DNP, FNP-C     [1]  Allergies Allergen Reactions   Penicillins Swelling and Other (See Comments)    Has patient had a PCN reaction causing immediate rash, facial/tongue/throat swelling, SOB  or lightheadedness with hypotension: Yes Has patient had a PCN reaction causing severe rash involving mucus membranes or skin necrosis: No Has patient had a PCN reaction that required hospitalization: No Has patient had a PCN reaction occurring within the last 10 years: No If all of the above answers are NO, then may proceed with Cephalosporin use.    Bee Venom Hives   "

## 2024-01-01 LAB — B12 AND FOLATE PANEL
Folate: 16.7 ng/mL
Vitamin B-12: 526 pg/mL (ref 232–1245)

## 2024-01-03 ENCOUNTER — Telehealth (HOSPITAL_BASED_OUTPATIENT_CLINIC_OR_DEPARTMENT_OTHER): Payer: Self-pay

## 2024-01-03 ENCOUNTER — Ambulatory Visit (HOSPITAL_BASED_OUTPATIENT_CLINIC_OR_DEPARTMENT_OTHER): Payer: Self-pay

## 2024-01-03 NOTE — Telephone Encounter (Signed)
 The patient called in checking to see if any medication has been called in since her cough has gotten worse and her provider told her to call her back if that was the case. I called and spoke with Randall and she will pass the information along to the providers nurse and provider and have someone call the patient back today. Please assist patient further.

## 2024-01-03 NOTE — Progress Notes (Signed)
 Hi Kayla May, Your vitamin b-12 and folate are both in normal range. Let's continue with you taking the supplement mentioned for hair growth as well as purposefully incorporating high quality protein (chicken, salmon, red meat etc) into your diet and see if we note a positive change. Please reach out with any concerns. Otherwise I will see you in 3 months for your physical!  Lauraine Norris

## 2024-01-03 NOTE — Telephone Encounter (Signed)
 Copied from CRM #[id number]. Topic: Clinical - Medical Advice >> Jan 01, 2024 10:06 AM Tiffini S wrote: Reason for CRM: Patient was seen yesterday for burning, breathe taking cough- no CP, SOB, pain and/ or fever- do not feel bad just a cough but would like an antibiotic sent to:   [facility] [address] [place] [address] Phone: [phone] Fax: [phone]   Call back number is verified as [phone]

## 2024-01-03 NOTE — Telephone Encounter (Signed)
 Hi Kayla May, I just saw your message regarding Kayla May's request. Unfortunately due to her cough only lasting for 3 days and not having any other symptoms, an antibiotic is not indicated and could cause more side effects than help her. Coughs can be caused by multiple things and tend to not be bacterial. If she is still coughing and would like to set up an acute visit next week I am happy to see her to reevaluate. If she feels like she is having chest pain or shortness of breath I suggest follow up with UC or ER.  Thanks!

## 2024-01-03 NOTE — Telephone Encounter (Signed)
 Called and spoke with pt who states she still has a cough that she had when she first saw Lauraine 12/23. States cough was mentioned at OV. Pt is requesting to be started on an abx since cough is not any better. Sarah, please advise.

## 2024-01-29 ENCOUNTER — Other Ambulatory Visit (HOSPITAL_BASED_OUTPATIENT_CLINIC_OR_DEPARTMENT_OTHER): Payer: Self-pay

## 2024-03-30 ENCOUNTER — Encounter (HOSPITAL_BASED_OUTPATIENT_CLINIC_OR_DEPARTMENT_OTHER)

## 2024-11-19 ENCOUNTER — Ambulatory Visit: Admitting: Radiology
